# Patient Record
Sex: Female | Born: 1970 | Race: Black or African American | Hispanic: No | Smoking: Never smoker
Health system: Southern US, Community
[De-identification: ages and names within clinical notes are randomized; demographics above are authoritative.]

## PROBLEM LIST (undated history)

## (undated) DIAGNOSIS — N970 Female infertility associated with anovulation: Secondary | ICD-10-CM

## (undated) HISTORY — PX: PELVIC LAPAROSCOPY: SHX162

---

## 2000-09-29 ENCOUNTER — Inpatient Hospital Stay (HOSPITAL_COMMUNITY): Admission: AD | Admit: 2000-09-29 | Discharge: 2000-09-29 | Payer: Self-pay | Admitting: Obstetrics & Gynecology

## 2000-09-29 ENCOUNTER — Encounter: Payer: Self-pay | Admitting: Obstetrics & Gynecology

## 2000-11-01 ENCOUNTER — Encounter: Admission: RE | Admit: 2000-11-01 | Discharge: 2000-11-01 | Payer: Self-pay | Admitting: Obstetrics & Gynecology

## 2000-11-08 ENCOUNTER — Encounter: Admission: RE | Admit: 2000-11-08 | Discharge: 2000-11-08 | Payer: Self-pay | Admitting: Obstetrics & Gynecology

## 2000-11-18 ENCOUNTER — Encounter: Admission: RE | Admit: 2000-11-18 | Discharge: 2000-11-18 | Payer: Self-pay | Admitting: Obstetrics & Gynecology

## 2002-01-05 ENCOUNTER — Encounter: Payer: Self-pay | Admitting: Internal Medicine

## 2002-01-05 ENCOUNTER — Encounter: Admission: RE | Admit: 2002-01-05 | Discharge: 2002-01-05 | Payer: Self-pay | Admitting: Internal Medicine

## 2003-04-11 ENCOUNTER — Inpatient Hospital Stay (HOSPITAL_COMMUNITY): Admission: AD | Admit: 2003-04-11 | Discharge: 2003-04-12 | Payer: Self-pay | Admitting: Obstetrics & Gynecology

## 2003-05-30 ENCOUNTER — Encounter: Admission: RE | Admit: 2003-05-30 | Discharge: 2003-05-30 | Payer: Self-pay | Admitting: Obstetrics and Gynecology

## 2003-06-27 ENCOUNTER — Encounter: Admission: RE | Admit: 2003-06-27 | Discharge: 2003-06-27 | Payer: Self-pay | Admitting: Obstetrics and Gynecology

## 2003-12-17 ENCOUNTER — Emergency Department (HOSPITAL_COMMUNITY): Admission: EM | Admit: 2003-12-17 | Discharge: 2003-12-17 | Payer: Self-pay | Admitting: Family Medicine

## 2003-12-17 ENCOUNTER — Inpatient Hospital Stay (HOSPITAL_COMMUNITY): Admission: AD | Admit: 2003-12-17 | Discharge: 2003-12-17 | Payer: Self-pay | Admitting: *Deleted

## 2004-02-03 ENCOUNTER — Ambulatory Visit (HOSPITAL_COMMUNITY): Admission: RE | Admit: 2004-02-03 | Discharge: 2004-02-03 | Payer: Self-pay | Admitting: Obstetrics & Gynecology

## 2004-03-13 ENCOUNTER — Ambulatory Visit (HOSPITAL_COMMUNITY): Admission: RE | Admit: 2004-03-13 | Discharge: 2004-03-13 | Payer: Self-pay | Admitting: Obstetrics & Gynecology

## 2004-12-10 ENCOUNTER — Inpatient Hospital Stay (HOSPITAL_COMMUNITY): Admission: AD | Admit: 2004-12-10 | Discharge: 2004-12-10 | Payer: Self-pay | Admitting: Obstetrics & Gynecology

## 2006-06-27 ENCOUNTER — Ambulatory Visit: Payer: Self-pay | Admitting: Family Medicine

## 2006-08-18 ENCOUNTER — Encounter (INDEPENDENT_AMBULATORY_CARE_PROVIDER_SITE_OTHER): Payer: Self-pay | Admitting: Gynecology

## 2006-08-18 ENCOUNTER — Ambulatory Visit: Payer: Self-pay | Admitting: Gynecology

## 2006-09-15 ENCOUNTER — Ambulatory Visit: Payer: Self-pay | Admitting: Obstetrics & Gynecology

## 2008-02-07 ENCOUNTER — Inpatient Hospital Stay (HOSPITAL_COMMUNITY): Admission: AD | Admit: 2008-02-07 | Discharge: 2008-02-08 | Payer: Self-pay | Admitting: Obstetrics & Gynecology

## 2009-01-18 ENCOUNTER — Inpatient Hospital Stay (HOSPITAL_COMMUNITY): Admission: AD | Admit: 2009-01-18 | Discharge: 2009-01-19 | Payer: Self-pay | Admitting: Obstetrics & Gynecology

## 2009-02-26 ENCOUNTER — Encounter (INDEPENDENT_AMBULATORY_CARE_PROVIDER_SITE_OTHER): Payer: Self-pay | Admitting: Obstetrics & Gynecology

## 2009-02-26 ENCOUNTER — Ambulatory Visit: Payer: Self-pay | Admitting: Obstetrics & Gynecology

## 2009-03-13 ENCOUNTER — Ambulatory Visit: Payer: Self-pay | Admitting: Obstetrics and Gynecology

## 2009-09-30 ENCOUNTER — Emergency Department (HOSPITAL_COMMUNITY): Admission: EM | Admit: 2009-09-30 | Discharge: 2009-09-30 | Payer: Self-pay | Admitting: Family Medicine

## 2010-08-17 ENCOUNTER — Encounter: Payer: Self-pay | Admitting: *Deleted

## 2010-10-19 LAB — POCT URINALYSIS DIP (DEVICE)
Ketones, ur: NEGATIVE mg/dL
Protein, ur: NEGATIVE mg/dL
Specific Gravity, Urine: 1.02 (ref 1.005–1.030)

## 2010-10-19 LAB — DIFFERENTIAL
Basophils Relative: 1 % (ref 0–1)
Eosinophils Absolute: 0.2 10*3/uL (ref 0.0–0.7)
Eosinophils Relative: 4 % (ref 0–5)
Monocytes Absolute: 0.3 10*3/uL (ref 0.1–1.0)
Monocytes Relative: 5 % (ref 3–12)

## 2010-10-19 LAB — POCT I-STAT, CHEM 8
BUN: 10 mg/dL (ref 6–23)
Creatinine, Ser: 0.9 mg/dL (ref 0.4–1.2)
Potassium: 3.7 mEq/L (ref 3.5–5.1)
Sodium: 142 mEq/L (ref 135–145)
TCO2: 28 mmol/L (ref 0–100)

## 2010-10-19 LAB — CBC
HCT: 41.8 % (ref 36.0–46.0)
Hemoglobin: 14 g/dL (ref 12.0–15.0)
MCHC: 33.6 g/dL (ref 30.0–36.0)
MCV: 89.9 fL (ref 78.0–100.0)
RBC: 4.65 MIL/uL (ref 3.87–5.11)

## 2010-10-19 LAB — POCT PREGNANCY, URINE
Preg Test, Ur: NEGATIVE
Preg Test, Ur: NEGATIVE

## 2010-10-31 LAB — POCT URINALYSIS DIP (DEVICE)
Bilirubin Urine: NEGATIVE
Glucose, UA: NEGATIVE mg/dL
Hgb urine dipstick: NEGATIVE
Ketones, ur: NEGATIVE mg/dL
Nitrite: NEGATIVE
Protein, ur: NEGATIVE mg/dL
Specific Gravity, Urine: <=1.005 (ref 1.005–1.030)
Urobilinogen, UA: 0.2 mg/dL (ref 0.0–1.0)
pH: 5.5 (ref 5.0–8.0)

## 2010-11-02 LAB — URINALYSIS, ROUTINE W REFLEX MICROSCOPIC
Bilirubin Urine: NEGATIVE
Nitrite: NEGATIVE
Specific Gravity, Urine: 1.02 (ref 1.005–1.030)
Urobilinogen, UA: 0.2 mg/dL (ref 0.0–1.0)

## 2010-11-02 LAB — COMPREHENSIVE METABOLIC PANEL
Albumin: 3.7 g/dL (ref 3.5–5.2)
BUN: 12 mg/dL (ref 6–23)
Creatinine, Ser: 0.59 mg/dL (ref 0.4–1.2)
GFR calc Af Amer: >60 mL/min (ref >60)
Potassium: 3.8 mEq/L (ref 3.5–5.1)
Total Protein: 6.8 g/dL (ref 6.0–8.3)

## 2010-11-02 LAB — GC/CHLAMYDIA PROBE AMP, GENITAL
Chlamydia, DNA Probe: NEGATIVE
GC Probe Amp, Genital: NEGATIVE

## 2010-11-02 LAB — CBC
HCT: 37.5 % (ref 36.0–46.0)
MCV: 88.9 fL (ref 78.0–100.0)
Platelets: 196 10*3/uL (ref 150–400)
RDW: 14.7 % (ref 11.5–15.5)

## 2010-11-02 LAB — AMYLASE: Amylase: 50 U/L (ref 27–131)

## 2010-11-02 LAB — WET PREP, GENITAL
Clue Cells Wet Prep HPF POC: NONE SEEN
Trich, Wet Prep: NONE SEEN

## 2010-12-08 NOTE — Group Therapy Note (Signed)
[name], [name] NO.:  [id number]   MEDICAL RECORD NO.:  [id number]          PATIENT TYPE:  WOC   LOCATION:  [facility]                   FACILITY:  [facility]   PHYSICIAN:  Argentina Donovan, MD        DATE OF BIRTH:  Dec 20, 1970   DATE OF SERVICE:                                  CLINIC NOTE   The patient is a 40 year old African female from [place], gravida 1, para 0-  0-1-0 with a last pregnancy approximately 15 years ago.  She was married  at one time and had a divorce because she was infertile and she had a  laparoscopy done by Dr. Seymour Bars, which apparently showed polycystic  ovaries and she told her she was not ovulating.  She has a new partner  for 2 years who has had three children in the past and she has never  gotten pregnant.  Her periods are very irregular, sometimes going as  long as 4 months without a period.  The patient has some sign of  hirsutism with abnormal hair on her face and some acne and she is 5 feet  5 inches tall and weighs 252 pounds.  Her blood pressure is normal at  129/83 and her pulse is 72 per minute.  She was originally complaining  of upper right quadrant pain.  Ultrasound of the pelvis and the abdomen,  the pelvis was completely normal.  The abdomen showed a gallbladder  without stones.  From listening to her complaints, it sounds like she  could have some gallbladder sludge.  I have told her to try and keep a  calendar and see if what she ate especially fatty foods, which related  to increase in pain in that area.  In the interim, we may start her on  some Clomid after a withdrawal with Provera prior to doing any other  significant workup, although she has had a thyroid test in the past,  which was normal, and she was also checked for prolactin, which was  normal.  I think that this is probably a typical case of polycystic  ovarian syndrome and the patient is not a diabetic.  We will put her on  a basal temperature thermometer chart.  She  apparently took 2 months of  Clomid before in a low dose and did not respond.  I have started her on  100 mg a day for 5 days for 2 months on a temperature chart, after that  she will come in, I told her after the first month if she does not have  a period within 30 days after she completes the first month of Clomid  that I want to see her sooner than that.  She seems to understand, she  has taken a temperature before, so prior to doing any workup such as  sperm analysis or further chemical workup that will cost her more money,  I think they will go ahead and give her a trial on the Clomid.   IMPRESSION:  Polycystic ovarian syndrome, primary infertility, history  of normal laparoscopy.  ______________________________  Argentina Donovan, MD     PR/MEDQ  D:  03/13/2009  T:  03/14/2009  Job:  [id number]

## 2010-12-08 NOTE — Group Therapy Note (Signed)
[name], [name] NO.:  [id number]   MEDICAL RECORD NO.:  [id number]          PATIENT TYPE:  WOC   LOCATION:  [facility]                   FACILITY:  [facility]   PHYSICIAN:  Dorthula Perfect, MD     DATE OF BIRTH:  November 02, 1970   DATE OF SERVICE:  02/26/2009                                  CLINIC NOTE   A 40 year old, African female, gravida 1, aborta 1, last menstrual  period started July 16 is here with a complaint of pelvic pain and  amenorrhea referred from the [facility].  She was seen here February 2008 by  myself complaining of irregular periods and wanting to become pregnant.  At that time, the clinic Manager had discussed with her the what is  involved with an infertility evaluation.  At that time, her TSH,  prolactin, and glucose levels that has been ordered earlier by Dr.  Mia Creek were normal.  In 2009, she had regular monthly periods.  This  year, 2010, her periods occur about every 3 months.  She is doing  nothing for birth control.  For the past 2 months, she has had right-  sided pain when she is ambulatory, but not while sleeping.   REVIEW OF SYSTEMS:  She has no food intolerance.  She has occasional  constipation and that she will have a bowel movement every couple days.  She has no history of diarrhea.  She has no urinary frequency.  She does  not see blood in her urine and also has no dysuria.   PHYSICAL EXAMINATION:  VITAL SIGNS:  Height 5 feet 5 inches, weight 252  pounds, that is gain of about 17 pounds since 2 years ago.  Blood  pressure is 129/83.  ABDOMEN:  Her abdomen is obese.  She has direct tenderness in the right  upper quadrant.  She has no other abdominal tenderness.  She has no  guarding or rebound tenderness.  She has a questionable right CVA  tenderness, but I wonder if I am just jostling the abdominal contents  around.  Her upper abdominal pain does not radiate downwards.  PELVIC:  External genitalia, BUS glands normal.  Vaginal vault  was  epithelialized as was the cervix.  Pap smear was done.  BIMANUAL:  Done.  It was quite difficult to feel the uterus in the  adnexal areas, but I believe they are normal.   IMPRESSION:  1. Right upper quadrant pain.  2. Oligomenorrhea.   DISPOSITION:  1. Micro urinalysis.  2. Gallbladder ultrasound.  3. She will return in 2 weeks for results and at that time, we will      talk with the clinic staff about starting a infertility workup.      This has been discussed with her in the past.          ______________________________  Dorthula Perfect, MD    ER/MEDQ  D:  02/26/2009  T:  02/27/2009  Job:  [id number]

## 2010-12-11 NOTE — Group Therapy Note (Signed)
NAMEMICHALENE, [name] NO.:  [id number]   MEDICAL RECORD NO.:  [id number]          PATIENT TYPE:  WOC   LOCATION:  [facility]                   [facility]ACILITY:  [facility]   PHYSICIAN:  Ginger Carne, MD DATE OF BIRTH:  11-05-1970   DATE OF SERVICE:  08/18/2006                                  CLINIC NOTE   REASON FOR CONSULTATION:  Oligomenorrhea.   This patient is a 40 year old African American female who has had a  multiple-year history of oligomenorrhea and concerns regarding  infertility.  At the outset of her appointment, Tresa Endo __________, clinic  manager, discussed with the patient full disclosure regarding costs up  front for infertility evaluation. It was agreed between the patient and  Ms. __________ that this point would be directed solely for issues about  oligomenorrhea, and infertility would be deferred to a later time.  The  patient denies galactorrhea, chronic headaches, and concerns regarding  hirsutism.  She takes no medications that would contribute to  oligomenorrhea.  The patient bleeds approximately 4-7 days every 3-6  months.  She does not use oral contraceptives.   PHYSICAL EXAMINATION:  GENERAL:  No evidence of hirsutism.  ABDOMEN:  Demonstrates central obesity.  PELVIC:  Uterus small, anteverted, and flexed.  Both adnexa palpable,  found to be normal.   IMPRESSION:  Oligomenorrhea, probable polycystic ovarian syndrome.   PLAN:  TSH, serum HCG prolactin, and 75 g 2-hour glucose insulin level  testing was ordered.  At the time of this dictation, TSH, prolactin were  within normal limits, and serum HCG demonstrated no evidence for  pregnancy.  The patient was unable to afford the cost of a 75 g 2-hour  glucose insulin level, according to nursing staff, and this was deferred  as well.  She will return in 3 weeks for follow-up conversation  regarding her testing and further management of oligomenorrhea.     ______________________________  Ginger Carne, MD     SHB/MEDQ  D:  08/19/2006  T:  08/19/2006  Job:  [id number]

## 2010-12-11 NOTE — Group Therapy Note (Signed)
NAMEKRISTOL, [name] NO.:  [id number]   MEDICAL RECORD NO.:  [id number]          PATIENT TYPE:  WOC   LOCATION:  [facility]                   [facility]AC[facility]   PHYSICIAN:  Dorthula Perfect, MD     DATE OF BIRTH:  1971/07/11   DATE OF SERVICE:  09/15/2006                                  CLINIC NOTE   Please note this patient has two medical record numbers. One is  [id number] and the other medical record number is [id number]. This can  potentially have confusion when looking for her records.   A 39 year old [place] American female with a long history of  oligomenorrhea with concerns about having regular periods and becoming  pregnant who returns for followup visit. She was seen here by Dr.  Mia Creek on August 18, 2006. The clinic manager at that time discussed  with her the cost for infertility evaluation. Lab work was done. TSH,  prolactin and glucose levels were normal.   She says she was seen in the [facility] recently and was told she had a  Trichomonas infection. Review of both history numbers does not reveal  any information on that visit. For some time, she has had irregular  periods. Her last period was in December. She will go as long as three  months without having a period. She does not have other gyn complaints.   PHYSICAL EXAMINATION:  Is limited to the abdomen and pelvis. Her abdomen  is somewhat obese, soft and nontender. No masses are felt.  PELVIC: Is basically normal. The uterus is small and of normal size.  Adnexal areas were normal.   A wet prep was done which did not confirm the patient as having  Trichomonas. She does however have clue cells noted compatible with  bacterial vaginal infection.   IMPRESSION:  1. Bacterial vaginitis.  2. Irregular menstrual periods (oligomenorrhea).   DISPOSITION:  1. Flagyl 500 mg to take one twice a day for seven days, fourteen      tablets.  2. After much discussion with the patient, I get the impression  that      she wants to have regular periods all the time, but does not want      to take birth control pills because then she will not be able to      get pregnant. Therefore, I have given her a prescription for      Provera 10 mg 10 tablets with three refills to take first      prescription for the next ten days. I have told her this will most      likely bring on her period this time only. The clinic manager spoke      with the patient again and the patient understands that to carry on      with infertility, IE, Clomid 50 mg or 100 mg the      former of which she was on in 2004, will require her being logged      in under the infertility codes and co-payments. She understands      this.  ______________________________  Dorthula Perfect, MD     ER/MEDQ  D:  09/15/2006  T:  09/15/2006  Job:  [id number]

## 2010-12-11 NOTE — Op Note (Signed)
NAMEBENTLEIGH, WAREN                         ACCOUNT NO.:  [id number]   MEDICAL RECORD NO.:  [id number]                   PATIENT TYPE:  AMB   LOCATION:  [facility]                                  [facility]   PHYSICIAN:  Genia Del, M.D.             DATE OF BIRTH:  [birth year]   DATE OF PROCEDURE:  03/13/2004  DATE OF DISCHARGE:                                 OPERATIVE REPORT   PREOPERATIVE DIAGNOSES:  1. Primary infertility.  2. History of left ectopic pregnancy.  3. Left proximal tubal obstruction per hysterosalpingography.   POSTOPERATIVE DIAGNOSES:  1. Primary infertility.  2. History of left ectopic pregnancy.  3. Left proximal tubal obstruction per hysterosalpingography.  4. Confirmed left proximal tubal obstruction.   INTERVENTION:  Open laparoscopy with chromopertubation with methylene blue.   SURGEON:  Genia Del, M.D.   ASSISTANT:  Richardean Sale, M.D.   ANESTHESIOLOGIST:  Raul Del, M.D.   DESCRIPTION OF PROCEDURE:  Under general anesthesia with endotracheal  intubation, the patient is in lithotomy position for operative laparoscopy.  She is prepped with Hibiclens on the abdominal suprapubic, vulva and vaginal  areas and draped as usual. The vaginal exam reveals an anteverted uterus,  normal volume, no adnexal mass. The speculum is introduced in the vagina,  the anterior lip of the cervix is grasped with a tenaculum and the uterus is  cannulated. We removed the speculum. Abdominally we infiltrated the  subcutaneous tissue with Marcaine 0.25 7 mL. We then make an infraumbilical  incision over 2 cm with a scalpel. We opened the aponeurosis with Mayo  scissors under direct vision and opened the parietoperitineum with the Mayo  scissors under direct vision. We make a pursestring stitch of #0 Vicryl all  around the aponeurosis. We introduce the Hasson and the laparoscope at that  level. We inspect the pelvic cavity, no adhesion is present  anteriorly  therefore a 5 mm trocar is put in place on the left iliac area. We  infiltrate the subcutaneous tissue with Marcaine 3 mL. We make a scalpel  incision over 5 mm and introduce the 5 mm trocar at that level under direct  vision. The probe is inserted.  We inspect the pelvic cavity, the uterus is  normal in size and appearance, it is anteverted and mobile. No lesion is  present on the anterior cul-de-sac and posterior cul-de-sac, no adhesion, no  evidence of endometriosis. The right adnexa is completely normal. The  fimbria are well seen and healthy.  The right ovary is normal in appearance  and volume. The last adnexa appears normal distally with good fimbria. The  left ovary is normal in appearance and size. Some possible scarring is  present at the proximal aspect of the left tube but no adhesion is seen. We  inspect the abdominal cavity, the liver and bowels are normal to inspection.  We then proceed with methylene blue chromopertubation that shows  a very good  passage on the right side. The methylene blue is seen at the distal end of  the right tube going into the intraperitoneal cavity so the right tube is  normal and patent. The left tube does not fill in with methylene blue at  all. There appears to be a proximal obstruction of the left tube, no  methylene blue is seen at the distal end of the left tube.  We irrigate and  suction the abdominopelvic cavity with a Nezhat, hemostasis is adequate. The  instruments are removed, the CO2 is evacuated. The left 5 mm trocar is  removed under direct vision. We removed the laparoscope and the Hasson at  the infraumbilical incision. We feel that the pursestring suture of #0  Vicryl is not satisfactory at the aponeurosis, we remove it and redo it with  a #0 Vicryl again. This appears stronger and is all around at the level of  the aponeurosis. We then attach that suture making sure that no  intraabdominal content comes in. We then close  the skin with a subcuticular  stitch of 4-0 Monocryl, hemostasis is adequate at that level. Dermabond is  used at the left iliac incision. The instruments are removed vaginally, the  estimated blood loss was minimal. No complication occurred and the patient  was transferred to the recovery room in good status.                                               Genia Del, M.D.    ML/MEDQ  D:  03/13/2004  T:  03/13/2004  Job:  [id number]

## 2010-12-11 NOTE — Group Therapy Note (Signed)
[name], DEMETRIUS NO.:  [id number]   MEDICAL RECORD NO.:  [id number]                   PATIENT TYPE:  OUT   LOCATION:  [facility]                           FACILITY:  [facility]   PHYSICIAN:  Tinnie Gens, MD                     DATE OF BIRTH:  03-Sep-1970   DATE OF SERVICE:  05/30/2003                                    CLINIC NOTE   CHIEF COMPLAINT:  Infertility.   HISTORY OF PRESENT ILLNESS:  The patient is a 40 year old G1, P0 who has  previously been seen in this clinic and several others for secondary  infertility.  Apparently she was pregnant in her youth around 34 and had a  termination.  Since that time she has been with two separate partners  including one that has two other children with unprotected intercourse for  an extended period of time without any fertility.  She has oligomenorrhea  and what seems to be oligo-ovulation.  She has reported here previously that  she has no history of pelvic infections.  However, she brings records today  from a clinic in [place] which does reveal possible history of two pelvic  infections.  She was divorced from her first husband because she could not  have children.  She has been with her recent partner who has two children  for five months with no conception.  She does have a work-up that included a  normal TSH in this clinic two years ago as well as the records that she  brings with her from [place] which show a normal thyroid panel, an LH/FSH  ratio of greater than 3:1 with an LH of 23.83 and an FSH of 7.28.  Prolactin  is normal.  Testosterone is normal.  DHEAS was 500.  These findings are  somewhat suggestive of PCOS.  She has not ever had an HSG.  She had it  scheduled and never got it completed.  Her partner has not had a current  semen analysis, although he previously has two children.  The patient has  been given basal body temperature charts in the past.  However, it does not  look like she has  had enough instruction as to how to complete it.  She has  never been on any medication for infertility.   PAST MEDICAL HISTORY:  Negative.   PAST SURGICAL HISTORY:  Negative.   MEDICATIONS:  None.   ALLERGIES:  None.   GYN HISTORY:  Menarche 17.  Irregular cycles every two to three months.  Periods last five days.  She does have moderate amount of pain with periods.  She has no history of abnormal Pap smears.  Her current last menstrual  period was April 13, 2003.   FAMILY HISTORY:  Diabetes and hypertension in a father and mother.   SOCIAL HISTORY:  She denies alcohol, drugs, or tobacco use.  She currently  lives  with her fiance.  She braids hair for a living.   REVIEW OF SYSTEMS:  14-point review of systems is reviewed and negative with  the exception of fatigue, nausea, vaginal odor, and vaginal itching.   PHYSICAL EXAMINATION:  GENERAL:  She is a well-developed, somewhat obese  black female in no acute distress.  VITAL SIGNS:  Pulse 89, blood pressure 112/74, weight 228.6.  LUNGS:  Clear bilaterally.  CARDIOVASCULAR:  Regular rate, rhythm.  No murmurs, rubs, or gallops.  ABDOMEN:  Soft, nontender, nondistended.  NECK:  Supple.  She has normal feeling thyroid.  GENITOURINARY:  Normal external female genitalia.  The cervix is visualized  and is nulliparous and without lesions.  The uterus is anteverted, small,  nontender.  Adnexa are without tenderness.   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   PLAN:  Trial of basal body temperature testing after a Provera challenge.  She will also get financial counseling regarding payment for treatment  followed by probable trial of Glucophage plus or minus Clomid.  These are  fairly inexpensive medications that she can do without necessarily having to  undergo a broad spectrum of radiographic work-up or without needing  exceptionally expensive hormonal manipulation.                                                Tinnie Gens, MD    TP/MEDQ  D:  05/31/2003  T:  05/31/2003  Job:  [id number]

## 2011-01-05 ENCOUNTER — Emergency Department (HOSPITAL_COMMUNITY)
Admission: EM | Admit: 2011-01-05 | Discharge: 2011-01-06 | Disposition: A | Discharge status: Home / Self Care | Payer: Self-pay | Attending: Emergency Medicine | Admitting: Emergency Medicine

## 2011-01-05 DIAGNOSIS — IMO0001 Reserved for inherently not codable concepts without codable children: Secondary | ICD-10-CM | POA: Insufficient documentation

## 2011-01-05 DIAGNOSIS — R0602 Shortness of breath: Secondary | ICD-10-CM | POA: Insufficient documentation

## 2011-01-05 DIAGNOSIS — Z0000 Encounter for general adult medical examination without abnormal findings: Secondary | ICD-10-CM | POA: Insufficient documentation

## 2011-01-09 ENCOUNTER — Emergency Department (HOSPITAL_COMMUNITY)
Admission: EM | Admit: 2011-01-09 | Discharge: 2011-01-10 | Disposition: A | Discharge status: Home / Self Care | Payer: Self-pay | Attending: Emergency Medicine | Admitting: Emergency Medicine

## 2011-01-09 ENCOUNTER — Emergency Department (HOSPITAL_COMMUNITY): Payer: Self-pay

## 2011-01-09 DIAGNOSIS — R079 Chest pain, unspecified: Secondary | ICD-10-CM | POA: Insufficient documentation

## 2011-01-09 DIAGNOSIS — R0602 Shortness of breath: Secondary | ICD-10-CM | POA: Insufficient documentation

## 2011-01-09 DIAGNOSIS — J45909 Unspecified asthma, uncomplicated: Secondary | ICD-10-CM | POA: Insufficient documentation

## 2011-02-22 ENCOUNTER — Inpatient Hospital Stay (INDEPENDENT_AMBULATORY_CARE_PROVIDER_SITE_OTHER)
Admission: RE | Admit: 2011-02-22 | Discharge: 2011-02-22 | Disposition: A | Discharge status: Home / Self Care | Payer: Self-pay | Source: Ambulatory Visit | Attending: Family Medicine | Admitting: Family Medicine

## 2011-02-22 DIAGNOSIS — R002 Palpitations: Secondary | ICD-10-CM

## 2011-02-22 DIAGNOSIS — M940 Chondrocostal junction syndrome [Tietze]: Secondary | ICD-10-CM

## 2011-04-23 LAB — GC/CHLAMYDIA PROBE AMP, GENITAL
Chlamydia, DNA Probe: NEGATIVE
GC Probe Amp, Genital: NEGATIVE

## 2011-04-23 LAB — URINALYSIS, ROUTINE W REFLEX MICROSCOPIC
Glucose, UA: NEGATIVE
Hgb urine dipstick: NEGATIVE
Specific Gravity, Urine: <1.005 — ABNORMAL LOW
pH: 5.5

## 2011-04-23 LAB — WET PREP, GENITAL

## 2011-04-23 IMAGING — US US ABDOMEN COMPLETE
1 series · 14 of 25 positions shown · non-contrast
Comparison: None

CLINICAL DATA: Abdominal pain

COMPLETE ABDOMINAL ULTRASOUND

[Series 1: us abdomen complete · 0.28mm/px · 14 of 61 slices shown]
[im 1/61]
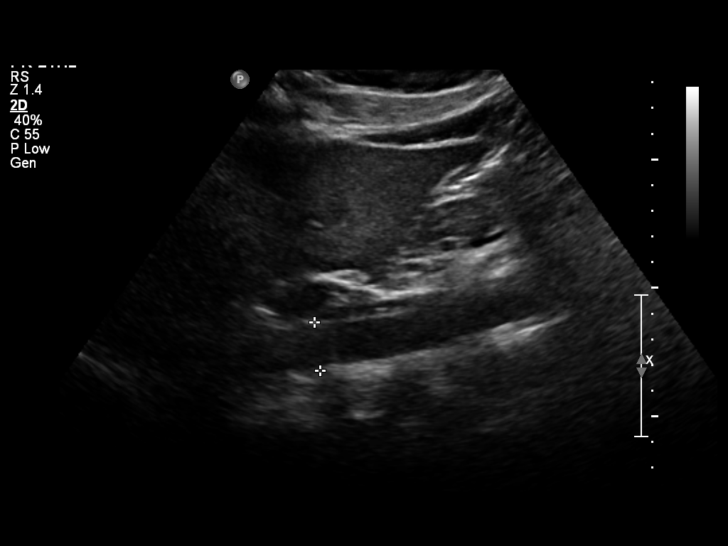
[im 6/61]
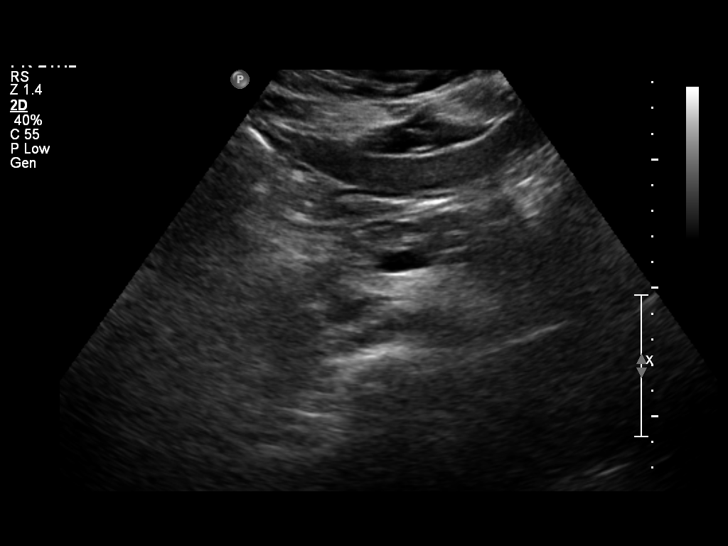
[im 11/61]
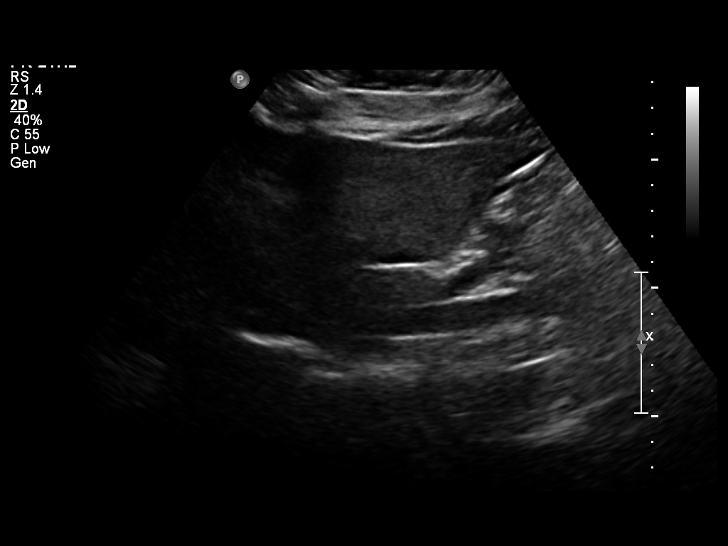
[im 16/61]
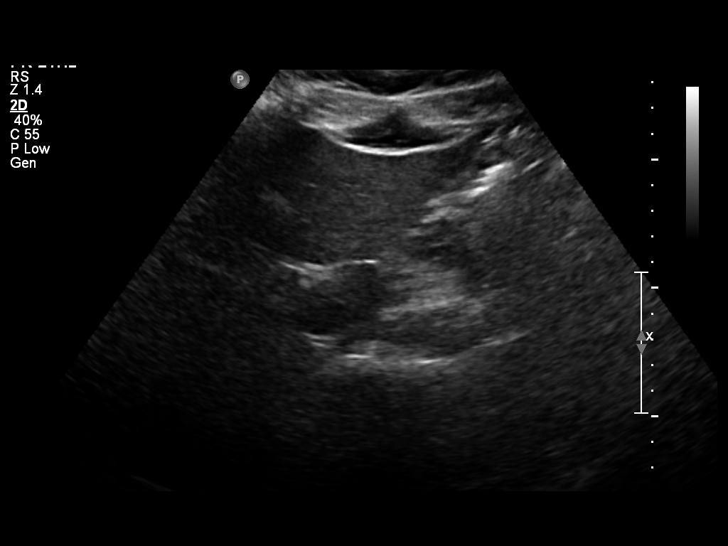
[im 21/61]
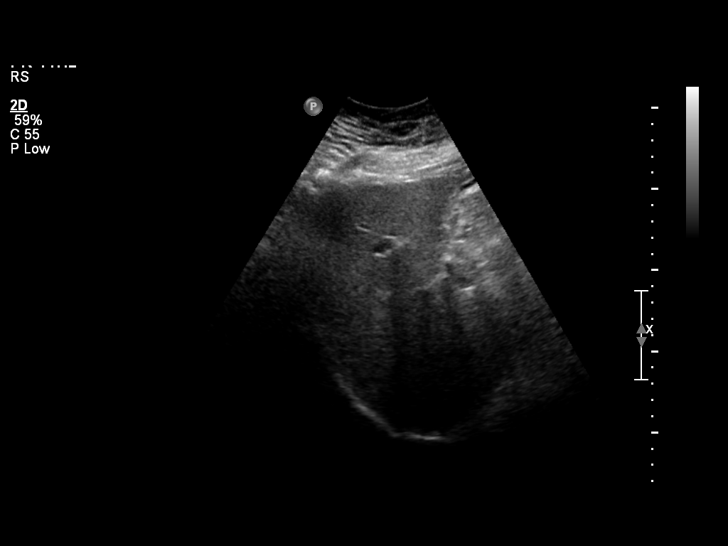
[im 23/61]
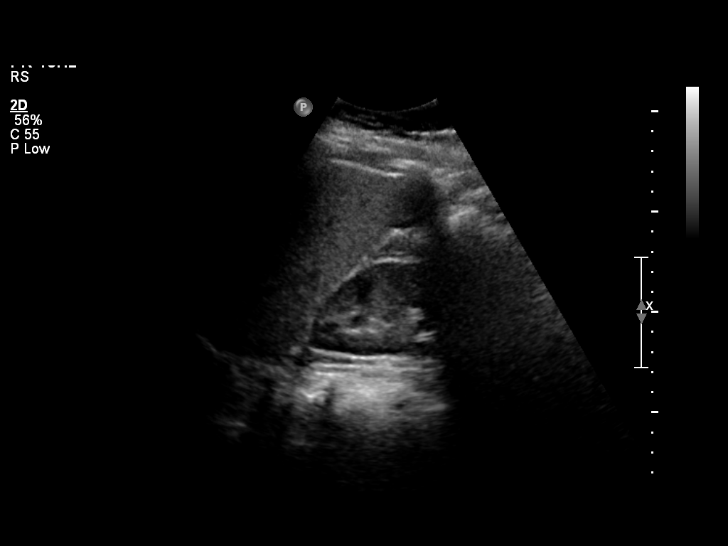
[im 28/61]
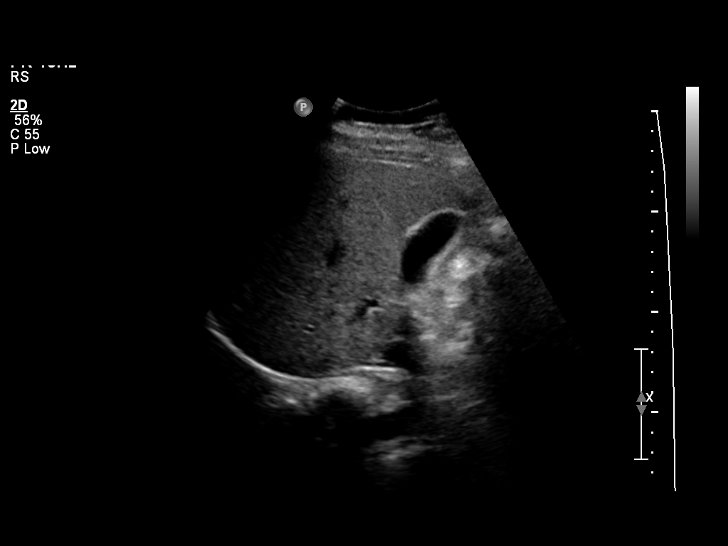
[im 33/61]
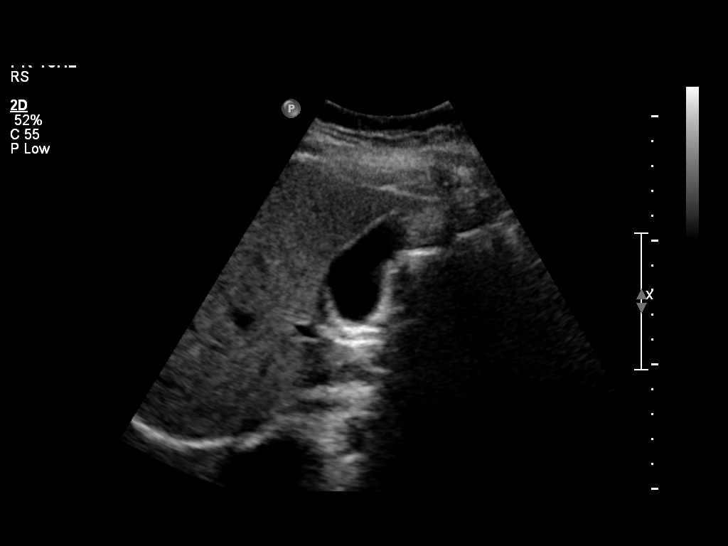
[im 38/61]
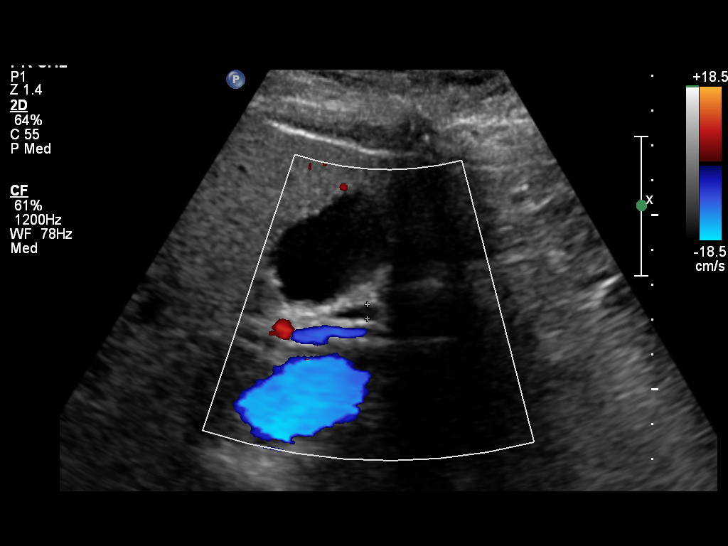
[im 41/61]
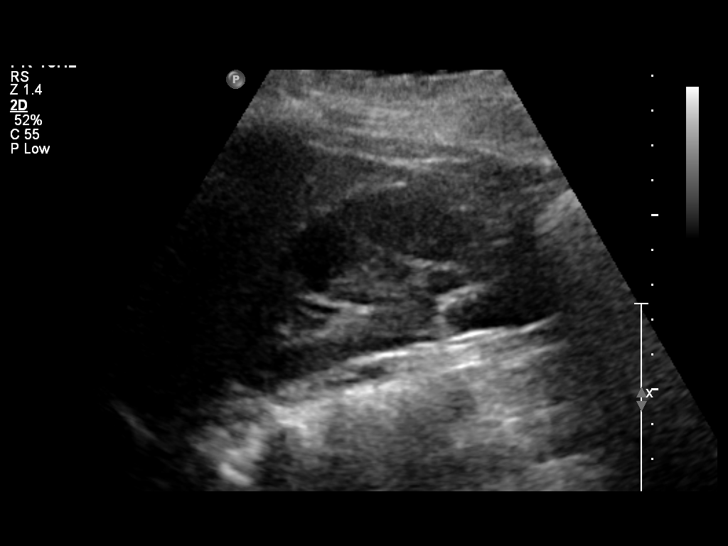
[im 46/61]
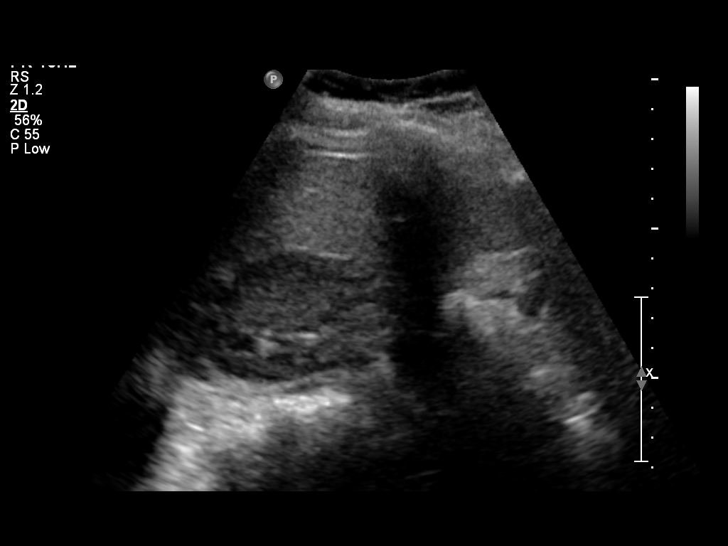
[im 51/61]
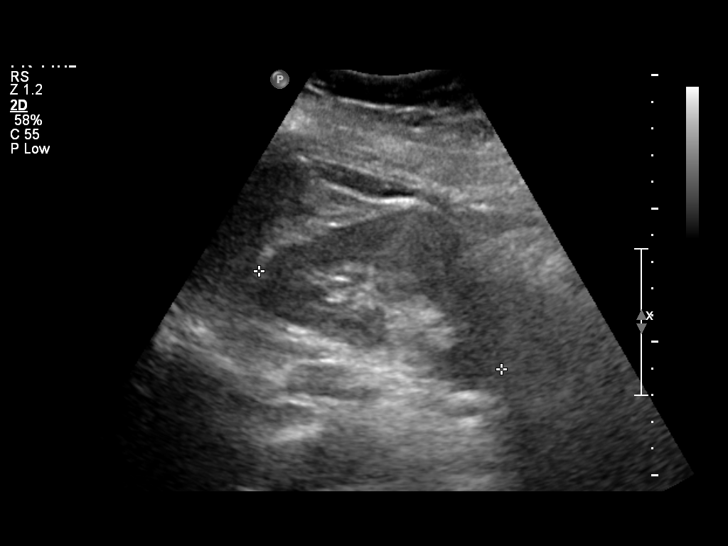
[im 56/61]
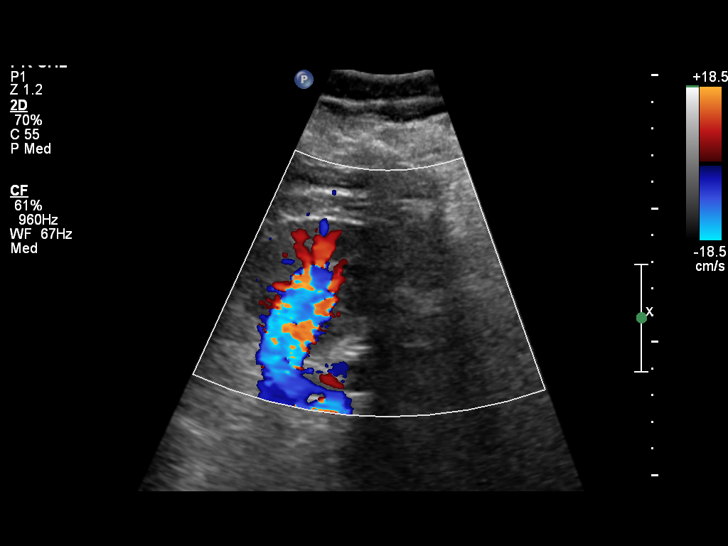
[im 61/61]
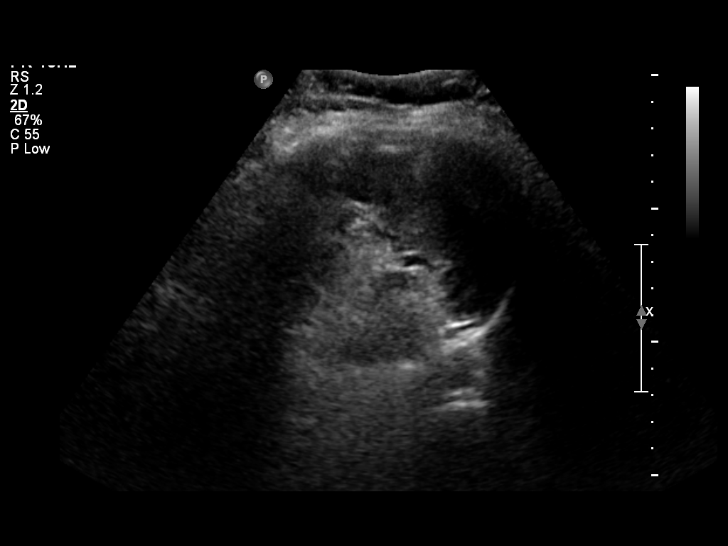

[14 of 25 positions shown; findings below may reference images not displayed]

FINDINGS: Gallbladder:  No gallstones, gallbladder wall thickening, or
pericholecystic fluid.

Common bile duct: Within normal limits in caliber, 4 mm.

Liver:  No focal parenchymal abnormalities.  Within normal limits
in parenchymal echogenicity.

Inferior vena cava:  Visualized portion unremarkable.

Pancreas:  Visualized portion unremarkable.

Spleen:  Within normal limits in size and echogenicity.

Right kidney:  Within normal limits in size and echogenicity. No
evidence of mass or hydronephrosis.

Left kidney:  Within normal limits in size and echogenicity. No
evidence of mass or hydronephrosis.

Abdominal aorta:  Within normal limits in caliber.
IMPRESSION: Unremarkable abdominal ultrasound.

## 2011-05-05 ENCOUNTER — Inpatient Hospital Stay (INDEPENDENT_AMBULATORY_CARE_PROVIDER_SITE_OTHER)
Admission: RE | Admit: 2011-05-05 | Discharge: 2011-05-05 | Disposition: A | Discharge status: Home / Self Care | Payer: Self-pay | Source: Ambulatory Visit | Attending: Family Medicine | Admitting: Family Medicine

## 2011-05-05 DIAGNOSIS — J45909 Unspecified asthma, uncomplicated: Secondary | ICD-10-CM

## 2011-05-05 DIAGNOSIS — J069 Acute upper respiratory infection, unspecified: Secondary | ICD-10-CM

## 2011-05-19 ENCOUNTER — Inpatient Hospital Stay (HOSPITAL_COMMUNITY)
Admission: AD | Admit: 2011-05-19 | Discharge: 2011-05-19 | Disposition: A | Discharge status: Home / Self Care | Payer: Self-pay | Source: Ambulatory Visit | Attending: Obstetrics and Gynecology | Admitting: Obstetrics and Gynecology

## 2011-05-19 ENCOUNTER — Encounter (HOSPITAL_COMMUNITY): Payer: Self-pay | Admitting: *Deleted

## 2011-05-19 DIAGNOSIS — K649 Unspecified hemorrhoids: Secondary | ICD-10-CM | POA: Insufficient documentation

## 2011-05-19 DIAGNOSIS — K648 Other hemorrhoids: Secondary | ICD-10-CM

## 2011-05-19 HISTORY — DX: Female infertility associated with anovulation: N97.0

## 2011-05-19 LAB — URINALYSIS, ROUTINE W REFLEX MICROSCOPIC
Bilirubin Urine: NEGATIVE
Glucose, UA: NEGATIVE mg/dL
Hgb urine dipstick: NEGATIVE
Specific Gravity, Urine: <1.005 — ABNORMAL LOW (ref 1.005–1.030)
pH: 5.5 (ref 5.0–8.0)

## 2011-05-19 MED ORDER — HYDROCORTISONE ACETATE 25 MG RE SUPP: 25.0000 mg | Freq: Two times a day (BID) | RECTAL | Status: AC

## 2011-05-19 NOTE — Progress Notes (Signed)
Pt reports she has a hemorrhoid and has used OTC's x 3 weeks but it isn't getting any better. Having bleeding and pain when she has a bm. Also reports rt side pain that radiates down into her legs x 1 year. States she has nausea sometimes and headaches sometimes. States with her last period the "blood was black". States she is afraid something is really wrong with her stomach.states she doesn't think she has ever seen a gynecologist.

## 2011-05-19 NOTE — ED Provider Notes (Signed)
History   Pt presents today c/o rectal pain. She states she has had hemorrhoids in the past and thinks she has another. She states she has had worsening pain for the past 3wks. She has used OTC medications without relief. She also states she has been constipated but is having stools. She denies fever, HA, vag dc, or abd pain at this time.  Chief Complaint  Patient presents with  . Abdominal Pain  . Hemorrhoids   HPI  OB History    Grav Para Term Preterm Abortions TAB SAB Ect Mult Living                  Past Medical History  Diagnosis Date  . Infertility associated with anovulation   . Asthma     Past Surgical History  Procedure Date  . Pelvic laparoscopy     No family history on file.  History  Substance Use Topics  . Smoking status: Never Smoker   . Smokeless tobacco: Never Used  . Alcohol Use: No    Allergies: No Known Allergies  Prescriptions prior to admission  Medication Sig Dispense Refill  . acetaminophen (TYLENOL) 325 MG tablet Take 650 mg by mouth every 6 (six) hours as needed. headache       . albuterol (PROVENTIL) (2.5 MG/3ML) 0.083% nebulizer solution Take 2.5 mg by nebulization every 6 (six) hours as needed. Shortness of breath        Review of Systems  Constitutional: Negative for fever.  Cardiovascular: Negative for chest pain.  Gastrointestinal: Positive for constipation. Negative for nausea, vomiting, abdominal pain and diarrhea.  Genitourinary: Negative for dysuria, urgency, frequency and hematuria.  Neurological: Negative for dizziness and headaches.  Psychiatric/Behavioral: Negative for depression and suicidal ideas.   Physical Exam   Blood pressure 124/79, pulse 80, temperature 98.1 F (36.7 C), resp. rate 20, height 5\' 6"  (1.676 m), weight 254 lb (115.214 kg), last menstrual period 05/10/2011, SpO2 98.00%.  Physical Exam  Nursing note and vitals reviewed. Constitutional: She is oriented to person, place, and time. She appears  well-developed and well-nourished. No distress.  HENT:  Head: Normocephalic and atraumatic.  Eyes: EOM are normal. Pupils are equal, round, and reactive to light.  GI: Soft. She exhibits no distension. There is no tenderness. There is no rebound and no guarding.  Genitourinary: Rectal exam shows internal hemorrhoid.       Small internal hemorrhoid noted. No bleeding.  Neurological: She is alert and oriented to person, place, and time.  Skin: Skin is warm and dry. She is not diaphoretic.  Psychiatric: She has a normal mood and affect. Her behavior is normal. Judgment and thought content normal.    MAU Course  Procedures  Results for orders placed during the hospital encounter of 05/19/11 (from the past 24 hour(s))  URINALYSIS, ROUTINE W REFLEX MICROSCOPIC     Status: Abnormal   Collection Time   05/19/11  9:15 PM      Component Value Range   Color, Urine YELLOW  YELLOW    Appearance CLEAR  CLEAR    Specific Gravity, Urine <1.005 (*) 1.005 - 1.030    pH 5.5  5.0 - 8.0    Glucose, UA NEGATIVE  NEGATIVE (mg/dL)   Hgb urine dipstick NEGATIVE  NEGATIVE    Bilirubin Urine NEGATIVE  NEGATIVE    Ketones, ur NEGATIVE  NEGATIVE (mg/dL)   Protein, ur NEGATIVE  NEGATIVE (mg/dL)   Urobilinogen, UA 0.2  0.0 - 1.0 (mg/dL)   Nitrite  NEGATIVE  NEGATIVE    Leukocytes, UA NEGATIVE  NEGATIVE   POCT PREGNANCY, URINE     Status: Normal   Collection Time   05/19/11  9:43 PM      Component Value Range   Preg Test, Ur NEGATIVE       Assessment and Plan  Hemorrhoid: discussed with pt at length. Will give Rx for anusol hc supp. She will use mirilax for constipation. Advised pt to f/u with PCP. Discussed diet, activity, risks, and precautions.  Clinton Gallant. Rice III, DrHSc, MPAS, PA-C  05/19/2011, 10:12 PM   Henrietta Hoover, PA 05/19/11 2216

## 2011-05-25 NOTE — ED Provider Notes (Signed)
Attestation of Attending Supervision of Advanced Practitioner: Evaluation and management procedures were performed by the PA/NP/CNM/OB Fellow under my supervision/collaboration. Chart reviewed and agree with management and plan.  [name] V 05/25/2011 6:50 PM

## 2011-11-11 ENCOUNTER — Inpatient Hospital Stay (HOSPITAL_COMMUNITY)
Admission: AD | Admit: 2011-11-11 | Discharge: 2011-11-12 | Disposition: A | Discharge status: Home / Self Care | Payer: Self-pay | Source: Ambulatory Visit | Attending: Obstetrics & Gynecology | Admitting: Obstetrics & Gynecology

## 2011-11-11 DIAGNOSIS — N949 Unspecified condition associated with female genital organs and menstrual cycle: Secondary | ICD-10-CM | POA: Insufficient documentation

## 2011-11-11 DIAGNOSIS — A6000 Herpesviral infection of urogenital system, unspecified: Secondary | ICD-10-CM | POA: Insufficient documentation

## 2011-11-11 NOTE — MAU Note (Signed)
Vaginal burning x 3 days, denies discharge. Used monostat last pm and this made it worse. External burning with urination. LMP 11/01/2011

## 2011-11-12 ENCOUNTER — Encounter (HOSPITAL_COMMUNITY): Payer: Self-pay | Admitting: *Deleted

## 2011-11-12 LAB — WET PREP, GENITAL
Clue Cells Wet Prep HPF POC: NONE SEEN
Trich, Wet Prep: NONE SEEN
Yeast Wet Prep HPF POC: NONE SEEN

## 2011-11-12 LAB — URINALYSIS, ROUTINE W REFLEX MICROSCOPIC
Glucose, UA: NEGATIVE mg/dL
Protein, ur: NEGATIVE mg/dL
Specific Gravity, Urine: 1.01 (ref 1.005–1.030)
pH: 5.5 (ref 5.0–8.0)

## 2011-11-12 LAB — URINE MICROSCOPIC-ADD ON

## 2011-11-12 LAB — HERPES SIMPLEX VIRUS(HSV) DNA BY PCR: HSV 1 DNA: NOT DETECTED

## 2011-11-12 MED ORDER — ACYCLOVIR 400 MG PO TABS: 400.0000 mg | ORAL_TABLET | Freq: Three times a day (TID) | ORAL | Status: AC

## 2011-11-12 MED ORDER — LIDOCAINE HCL 2 % EX GEL: CUTANEOUS | Status: DC | PRN

## 2011-11-12 MED ORDER — LIDOCAINE HCL 2 % EX GEL
Freq: Once | CUTANEOUS | Status: AC
  Administered 2011-11-12: 10 via TOPICAL
  Filled 2011-11-12: qty 20

## 2011-11-12 NOTE — MAU Provider Note (Signed)
Attestation of Attending Supervision of Advanced Practitioner: Evaluation and management procedures were performed by the OB Fellow/PA/CNM/NP under my supervision and collaboration. Chart reviewed, and agree with management and plan.  Renlee Floor, M.D. 11/12/2011 2:16 PM   

## 2011-11-12 NOTE — MAU Provider Note (Signed)
History     CSN: [id number]  Arrival date and time: 11/11/11 2329   None     Chief Complaint  Patient presents with  . Vaginal Pain   HPI 41 y.o. G0P0 with vaginal burning x 3 days, external burning with urination, no itching, no discharge. External pain with intercourse.    Past Medical History  Diagnosis Date  . Infertility associated with anovulation   . Asthma     Past Surgical History  Procedure Date  . Pelvic laparoscopy     No family history on file.  History  Substance Use Topics  . Smoking status: Never Smoker   . Smokeless tobacco: Never Used  . Alcohol Use: No    Allergies: Not on File  Prescriptions prior to admission  Medication Sig Dispense Refill  . acetaminophen (TYLENOL) 325 MG tablet Take 650 mg by mouth every 6 (six) hours as needed. headache       . albuterol (PROVENTIL HFA;VENTOLIN HFA) 108 (90 BASE) MCG/ACT inhaler Inhale 2 puffs into the lungs as needed. Used for resp. Distress.      Marland Kitchen ibuprofen (ADVIL,MOTRIN) 600 MG tablet Take 600 mg by mouth as needed. Used for pain.      . miconazole (MONISTAT-3) KIT Place vaginally at bedtime.        Review of Systems  Constitutional: Negative.   Respiratory: Negative.   Cardiovascular: Negative.   Gastrointestinal: Negative for nausea, vomiting, abdominal pain, diarrhea and constipation.  Genitourinary: Positive for dysuria. Negative for urgency, frequency, hematuria and flank pain.       Negative for vaginal bleeding, vaginal discharge  Musculoskeletal: Negative.   Neurological: Negative.   Psychiatric/Behavioral: Negative.    Physical Exam   Blood pressure 127/74, pulse 72, temperature 98.6 F (37 C), temperature source Oral, resp. rate 18, height 5\' 6"  (1.676 m), weight 245 lb (111.131 kg), last menstrual period 11/01/2011, SpO2 100.00%.  Physical Exam  Nursing note and vitals reviewed. Constitutional: She is oriented to person, place, and time. She appears well-developed and  well-nourished. No distress.  HENT:  Head: Normocephalic and atraumatic.  Cardiovascular: Normal rate.   Respiratory: Effort normal.  GI: Soft. Bowel sounds are normal. She exhibits no mass. There is no tenderness. There is no rebound and no guarding.  Genitourinary:    There is no rash or lesion on the right labia. There is no rash or lesion on the left labia. There is tenderness around the vagina. No bleeding around the vagina. Vaginal discharge (thin, white) found.  Musculoskeletal: Normal range of motion.  Neurological: She is alert and oriented to person, place, and time.  Skin: Skin is warm and dry.  Psychiatric: She has a normal mood and affect.    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

## 2012-03-08 ENCOUNTER — Emergency Department (HOSPITAL_COMMUNITY): Payer: Self-pay

## 2012-03-08 ENCOUNTER — Encounter (HOSPITAL_COMMUNITY): Payer: Self-pay | Admitting: Adult Health

## 2012-03-08 DIAGNOSIS — J45909 Unspecified asthma, uncomplicated: Secondary | ICD-10-CM | POA: Insufficient documentation

## 2012-03-08 DIAGNOSIS — Z79899 Other long term (current) drug therapy: Secondary | ICD-10-CM | POA: Insufficient documentation

## 2012-03-08 DIAGNOSIS — R0789 Other chest pain: Secondary | ICD-10-CM | POA: Insufficient documentation

## 2012-03-08 DIAGNOSIS — R0602 Shortness of breath: Secondary | ICD-10-CM | POA: Insufficient documentation

## 2012-03-08 DIAGNOSIS — R002 Palpitations: Secondary | ICD-10-CM | POA: Insufficient documentation

## 2012-03-08 LAB — BASIC METABOLIC PANEL
CO2: 25 mEq/L (ref 19–32)
Calcium: 9.2 mg/dL (ref 8.4–10.5)
Creatinine, Ser: 0.73 mg/dL (ref 0.50–1.10)
GFR calc Af Amer: >90 mL/min (ref >90)

## 2012-03-08 LAB — CBC
MCH: 29.5 pg (ref 26.0–34.0)
MCV: 89.2 fL (ref 78.0–100.0)
Platelets: 194 10*3/uL (ref 150–400)
RDW: 14.9 % (ref 11.5–15.5)

## 2012-03-08 LAB — URINALYSIS, ROUTINE W REFLEX MICROSCOPIC
Glucose, UA: NEGATIVE mg/dL
Ketones, ur: NEGATIVE mg/dL
Leukocytes, UA: NEGATIVE
Protein, ur: NEGATIVE mg/dL

## 2012-03-08 MED ORDER — ALBUTEROL SULFATE (5 MG/ML) 0.5% IN NEBU
5.0000 mg | INHALATION_SOLUTION | Freq: Once | RESPIRATORY_TRACT | Status: AC
  Administered 2012-03-09: 5 mg via RESPIRATORY_TRACT
  Filled 2012-03-08: qty 1

## 2012-03-08 NOTE — ED Notes (Signed)
Reports chest tightness, SOB, sore throat and headache since yesterday and lower abdominal pain that began 3 days that radiates to vagina and down legs... PT reports being out of inhaler. Bilateral lung sounds diminished lower lobes, clear  upper. Able to speak in full sentences. sats 100% RA.  chest pain is worse with deep inspiration.

## 2012-03-09 ENCOUNTER — Emergency Department (HOSPITAL_COMMUNITY): Payer: Self-pay

## 2012-03-09 ENCOUNTER — Emergency Department (HOSPITAL_COMMUNITY)
Admission: EM | Admit: 2012-03-09 | Discharge: 2012-03-09 | Disposition: A | Discharge status: Home / Self Care | Payer: Self-pay | Attending: Emergency Medicine | Admitting: Emergency Medicine

## 2012-03-09 ENCOUNTER — Encounter (HOSPITAL_COMMUNITY): Payer: Self-pay | Admitting: Radiology

## 2012-03-09 DIAGNOSIS — J45909 Unspecified asthma, uncomplicated: Secondary | ICD-10-CM

## 2012-03-09 LAB — POCT I-STAT TROPONIN I

## 2012-03-09 LAB — D-DIMER, QUANTITATIVE: D-Dimer, Quant: 0.83 ug/mL-FEU — ABNORMAL HIGH (ref 0.00–0.48)

## 2012-03-09 MED ORDER — IOHEXOL 350 MG/ML SOLN
100.0000 mL | Freq: Once | INTRAVENOUS | Status: AC | PRN
  Administered 2012-03-09: 100 mL via INTRAVENOUS

## 2012-03-09 MED ORDER — PREDNISONE 20 MG PO TABS
60.0000 mg | ORAL_TABLET | Freq: Once | ORAL | Status: AC
  Administered 2012-03-09: 60 mg via ORAL
  Filled 2012-03-09: qty 3

## 2012-03-09 MED ORDER — ALBUTEROL SULFATE HFA 108 (90 BASE) MCG/ACT IN AERS: 2.0000 | INHALATION_SPRAY | RESPIRATORY_TRACT | Status: DC | PRN

## 2012-03-09 MED ORDER — ALBUTEROL SULFATE (5 MG/ML) 0.5% IN NEBU
2.5000 mg | INHALATION_SOLUTION | Freq: Once | RESPIRATORY_TRACT | Status: AC
  Administered 2012-03-09: 2.5 mg via RESPIRATORY_TRACT
  Filled 2012-03-09: qty 0.5

## 2012-03-09 MED ORDER — IPRATROPIUM BROMIDE 0.02 % IN SOLN
0.5000 mg | Freq: Once | RESPIRATORY_TRACT | Status: AC
  Administered 2012-03-09: 0.5 mg via RESPIRATORY_TRACT
  Filled 2012-03-09: qty 2.5

## 2012-03-09 NOTE — ED Provider Notes (Signed)
History     CSN: [id number]  Arrival date & time 03/08/12  2057   First MD Initiated Contact with Patient 03/09/12 0146      Chief Complaint  Patient presents with  . Shortness of Breath    (Consider location/radiation/quality/duration/timing/severity/associated sxs/prior treatment) HPI Comments: Patient complains of 3 days of palpitations, shortness of breath, chest tightness and wheezing. She states she has a history of asthma and is out of her inhaler. She denies any fevers, vomiting. Tight chest tightness is worse with deep breathing and associated with dry cough and sore throat. Contrary to triage note she denies abdominal pain. She had similar symptoms 12 years ago and she traveled to [place] but has had no recent travel. Denies any history of blood clots. No leg pain or swelling.  The history is provided by the patient.    Past Medical History  Diagnosis Date  . Infertility associated with anovulation   . Asthma     Past Surgical History  Procedure Date  . Pelvic laparoscopy     History reviewed. No pertinent family history.  History  Substance Use Topics  . Smoking status: Never Smoker   . Smokeless tobacco: Never Used  . Alcohol Use: No    OB History    Grav Para Term Preterm Abortions TAB SAB Ect Mult Living   0               Review of Systems  Constitutional: Negative for fever, activity change and appetite change.  HENT: Positive for congestion and rhinorrhea.   Respiratory: Positive for cough, chest tightness and shortness of breath.   Cardiovascular: Positive for palpitations. Negative for chest pain.  Gastrointestinal: Negative for nausea, vomiting and abdominal pain.  Genitourinary: Negative for dysuria, hematuria, vaginal bleeding and vaginal discharge.  Musculoskeletal: Negative for back pain.  Neurological: Negative for headaches.    Allergies  Review of patient's allergies indicates no known allergies.  Home Medications   Current  Outpatient Rx  Name Route Sig Dispense Refill  . ACETAMINOPHEN 325 MG PO TABS Oral Take 650 mg by mouth every 6 (six) hours as needed. For headache    . ALBUTEROL SULFATE HFA 108 (90 BASE) MCG/ACT IN AERS Inhalation Inhale 2 puffs into the lungs every 6 (six) hours as needed. For shortness of breath and wheezing    . IBUPROFEN 600 MG PO TABS Oral Take 600 mg by mouth every 6 (six) hours as needed. For pain    . ALBUTEROL SULFATE HFA 108 (90 BASE) MCG/ACT IN AERS Inhalation Inhale 2 puffs into the lungs every 4 (four) hours as needed for wheezing. 1 Inhaler 0    BP 120/46  Pulse 98  Temp 97.9 F (36.6 C) (Oral)  Resp 18  SpO2 99%  LMP 02/23/2012  Physical Exam  Constitutional: She is oriented to person, place, and time. She appears well-developed and well-nourished. No distress.  HENT:  Head: Normocephalic and atraumatic.  Mouth/Throat: Oropharynx is clear and moist. No oropharyngeal exudate.  Eyes: Conjunctivae are normal.  Neck: Normal range of motion. Neck supple.  Cardiovascular: Normal rate, regular rhythm and normal heart sounds.   No murmur heard. Pulmonary/Chest: Effort normal. No respiratory distress. She has wheezes.       Good air exchange, scattered wheezes  Abdominal: There is no tenderness. There is no rebound and no guarding.  Musculoskeletal: Normal range of motion. She exhibits no edema and no tenderness.  Neurological: She is alert and oriented to person, place, and  time. No cranial nerve deficit.  Skin: Skin is warm.    ED Course  Procedures (including critical care time)  Labs Reviewed  D-DIMER, QUANTITATIVE - Abnormal; Notable for the following:    D-Dimer, Quant 0.83 (*)     All other components within normal limits  CBC  BASIC METABOLIC PANEL  URINALYSIS, ROUTINE W REFLEX MICROSCOPIC  POCT PREGNANCY, URINE  POCT I-STAT TROPONIN I  POCT I-STAT TROPONIN I   Dg Chest 2 View  03/08/2012  *RADIOLOGY REPORT*  Clinical Data: Short of breath.  CHEST - 2  VIEW  Comparison: 01/08/2011.  Findings:  Cardiopericardial silhouette within normal limits. Mediastinal contours normal. Trachea midline.  No airspace disease or effusion.  Lung volumes are low, but similar to prior.  IMPRESSION: Low volume chest with basilar atelectasis.  No acute cardiopulmonary disease or interval change.  Original Report Authenticated By: Andreas Newport, M.D.   Ct Angio Chest W/cm &/or Wo Cm  03/09/2012  *RADIOLOGY REPORT*  Clinical Data: Short of breath.  Pulmonary embolism.  Chest tightness.  CT ANGIOGRAPHY CHEST  Technique:  Multidetector CT imaging of the chest using the standard protocol during bolus administration of intravenous contrast. Multiplanar reconstructed images including MIPs were obtained and reviewed to evaluate the vascular anatomy.  Contrast: OMNIPAQUE IOHEXOL 350 MG/ML SOLN  Comparison: 03/08/2012 chest radiograph.  Findings: Substantial respiratory motion artifact is present on the examination and this precludes full evaluation of the pulmonary arteries.  There is also bolus dispersion.  There is no central pulmonary embolus identified.  The heart is grossly normal.  No effusion.  No axillary, mediastinal, or hilar adenopathy is identified. Incidental imaging of the upper abdomen is within normal limits. There are no aggressive osseous lesions identified.  The bones are within normal limits.  IMPRESSION:  1.  No central pulmonary embolus.  Full evaluation precluded by motion from breathing and dispersion of the contrast bolus. 2.  No acute abnormality.  Atelectasis of the lungs without airspace disease.  Original Report Authenticated By: Andreas Newport, M.D.     1. Asthma       MDM  Palpitations and shortness of breath for the past 3 days it feels like her asthma. Some chest tightness. No cough or fever. Patient reports being out of her inhaler.  Chest x-ray negative. Patient given nebulizer with good result. Ambulatory in the ED with no  desaturations.     Date: 03/09/2012  Rate: 75  Rhythm: normal sinus rhythm  QRS Axis: normal  Intervals: normal  ST/T Wave abnormalities: normal  Conduction Disutrbances:none  Narrative Interpretation:   Old EKG Reviewed: unchanged    Glynn Octave, MD 03/09/12 [phone]

## 2012-11-01 ENCOUNTER — Emergency Department (HOSPITAL_COMMUNITY)
Admission: EM | Admit: 2012-11-01 | Discharge: 2012-11-01 | Disposition: A | Discharge status: Home / Self Care | Payer: Self-pay | Attending: Emergency Medicine | Admitting: Emergency Medicine

## 2012-11-01 ENCOUNTER — Emergency Department (HOSPITAL_COMMUNITY): Payer: Self-pay

## 2012-11-01 DIAGNOSIS — Z8742 Personal history of other diseases of the female genital tract: Secondary | ICD-10-CM | POA: Insufficient documentation

## 2012-11-01 DIAGNOSIS — Z79899 Other long term (current) drug therapy: Secondary | ICD-10-CM | POA: Insufficient documentation

## 2012-11-01 DIAGNOSIS — Y9389 Activity, other specified: Secondary | ICD-10-CM | POA: Insufficient documentation

## 2012-11-01 DIAGNOSIS — S6992XA Unspecified injury of left wrist, hand and finger(s), initial encounter: Secondary | ICD-10-CM

## 2012-11-01 DIAGNOSIS — S6980XA Other specified injuries of unspecified wrist, hand and finger(s), initial encounter: Secondary | ICD-10-CM | POA: Insufficient documentation

## 2012-11-01 DIAGNOSIS — J45909 Unspecified asthma, uncomplicated: Secondary | ICD-10-CM | POA: Insufficient documentation

## 2012-11-01 DIAGNOSIS — S6990XA Unspecified injury of unspecified wrist, hand and finger(s), initial encounter: Secondary | ICD-10-CM | POA: Insufficient documentation

## 2012-11-01 DIAGNOSIS — Y929 Unspecified place or not applicable: Secondary | ICD-10-CM | POA: Insufficient documentation

## 2012-11-01 MED ORDER — IBUPROFEN 800 MG PO TABS: 800.0000 mg | ORAL_TABLET | Freq: Three times a day (TID) | ORAL | Status: DC

## 2012-11-01 MED ORDER — IBUPROFEN 400 MG PO TABS
800.0000 mg | ORAL_TABLET | Freq: Once | ORAL | Status: AC
  Administered 2012-11-01: 800 mg via ORAL
  Filled 2012-11-01: qty 2

## 2012-11-01 NOTE — ED Notes (Signed)
Pt reports injuring left middle finger while opening car door 4 days ago.  Pt reports swelling has increased and pain 10/10 and shoots to shoulder.  Pt is able to move the digit but states it is painful.  Slight swelling noted no redness.  RN does note slight deformity in angle of.  Pt alert oriented X4

## 2012-11-01 NOTE — ED Provider Notes (Signed)
History     CSN: [id number]  Arrival date & time 11/01/12  1059   First MD Initiated Contact with Patient 11/01/12 1128      Chief Complaint  Patient presents with  . Finger Injury    (Consider location/radiation/quality/duration/timing/severity/associated sxs/prior treatment) HPI Comments: Patient is a 42 year old female who presents with a 4 day history of left middle finger pain. The pain started suddenly after she slammed her finger in a car door. The pain is throbbing and severe with radiation to her shoulder. Patient reports associated swelling. Patient has not tried anything for symptoms. Movement and palpation makes the pain worse. No alleviating factors. Patient denies any other injuries.    Past Medical History  Diagnosis Date  . Infertility associated with anovulation   . Asthma     Past Surgical History  Procedure Laterality Date  . Pelvic laparoscopy      No family history on file.  History  Substance Use Topics  . Smoking status: Never Smoker   . Smokeless tobacco: Never Used  . Alcohol Use: No    OB History   Grav Para Term Preterm Abortions TAB SAB Ect Mult Living   0               Review of Systems  Musculoskeletal: Positive for arthralgias.  All other systems reviewed and are negative.    Allergies  Review of patient's allergies indicates no known allergies.  Home Medications   Current Outpatient Rx  Name  Route  Sig  Dispense  Refill  . acetaminophen (TYLENOL) 325 MG tablet   Oral   Take 650 mg by mouth every 6 (six) hours as needed. For headache         . ibuprofen (ADVIL,MOTRIN) 600 MG tablet   Oral   Take 600 mg by mouth every 6 (six) hours as needed. For pain         . albuterol (PROVENTIL HFA;VENTOLIN HFA) 108 (90 BASE) MCG/ACT inhaler   Inhalation   Inhale 2 puffs into the lungs every 6 (six) hours as needed. For shortness of breath and wheezing           BP 126/70  Pulse 76  Temp(Src) 98 F (36.7 C) (Oral)  Resp  18  SpO2 97%  LMP 10/18/2012  Physical Exam  Nursing note and vitals reviewed. Constitutional: She is oriented to person, place, and time. She appears well-developed and well-nourished. No distress.  HENT:  Head: Normocephalic and atraumatic.  Eyes: Conjunctivae are normal.  Cardiovascular: Normal rate, regular rhythm and intact distal pulses.  Exam reveals no gallop and no friction rub.   No murmur heard. Pulmonary/Chest: Effort normal and breath sounds normal. She has no wheezes. She has no rales. She exhibits no tenderness.  Abdominal: Soft. There is no tenderness.  Musculoskeletal: Normal range of motion.  Left middle finger PIP edematous and tender to palpation. Full ROM of affected finger.   Neurological: She is alert and oriented to person, place, and time. Coordination normal.  Speech is goal-oriented. Moves limbs without ataxia.   Skin: Skin is warm and dry.  Psychiatric: She has a normal mood and affect. Her behavior is normal.    ED Course  Procedures (including critical care time)  Labs Reviewed - No data to display Dg Hand Complete Left  11/01/2012  *RADIOLOGY REPORT*  Clinical Data: Pain and swelling in the left hand following injury 2 weeks ago.  LEFT HAND - COMPLETE 3+ VIEW  Comparison: None.  Findings: The mineralization and alignment are normal.  There is no evidence of acute fracture or dislocation.  There appears to be mild swelling of the long finger.  No foreign bodies are identified.  IMPRESSION: No acute osseous findings.  Apparent mild swelling in the long finger.   Original Report Authenticated By: Carey Bullocks, M.D.      1. Finger injury, left, initial encounter       MDM  11:55 AM Xray unremarkable for acute changes. Patient will have ibuprofen for pain. No neurovascular compromise. Patient instructed to ice injury. No further evaluation indicated at this time. Patient instructed to return with worsening or concerning symptoms.          Emilia Beck, [place] 11/02/12 [phone]

## 2012-11-02 NOTE — ED Provider Notes (Signed)
Medical screening examination/treatment/procedure(s) were performed by non-physician practitioner and as supervising physician I was immediately available for consultation/collaboration.   Loren Racer, MD 11/02/12 [phone]

## 2013-01-30 DIAGNOSIS — Z8742 Personal history of other diseases of the female genital tract: Secondary | ICD-10-CM | POA: Insufficient documentation

## 2013-01-30 DIAGNOSIS — R062 Wheezing: Secondary | ICD-10-CM | POA: Insufficient documentation

## 2013-01-30 DIAGNOSIS — R51 Headache: Secondary | ICD-10-CM | POA: Insufficient documentation

## 2013-01-30 DIAGNOSIS — R000 Tachycardia, unspecified: Secondary | ICD-10-CM | POA: Insufficient documentation

## 2013-01-30 DIAGNOSIS — J069 Acute upper respiratory infection, unspecified: Secondary | ICD-10-CM | POA: Insufficient documentation

## 2013-01-30 DIAGNOSIS — R509 Fever, unspecified: Secondary | ICD-10-CM | POA: Insufficient documentation

## 2013-01-30 DIAGNOSIS — J029 Acute pharyngitis, unspecified: Secondary | ICD-10-CM | POA: Insufficient documentation

## 2013-01-30 DIAGNOSIS — J45901 Unspecified asthma with (acute) exacerbation: Secondary | ICD-10-CM | POA: Insufficient documentation

## 2013-01-30 NOTE — ED Notes (Signed)
Pt states she has had sob and chest wall pain x 2 days. Wheezing in L upper lobe per EMS. Duo neb en route. 98 RA. Hx of asthma. Home neb and sudafed tried w/ no relief.

## 2013-01-31 ENCOUNTER — Emergency Department (HOSPITAL_COMMUNITY)
Admission: EM | Admit: 2013-01-31 | Discharge: 2013-01-31 | Disposition: A | Discharge status: Home / Self Care | Payer: Self-pay | Attending: Emergency Medicine | Admitting: Emergency Medicine

## 2013-01-31 ENCOUNTER — Encounter (HOSPITAL_COMMUNITY): Payer: Self-pay | Admitting: Emergency Medicine

## 2013-01-31 ENCOUNTER — Emergency Department (HOSPITAL_COMMUNITY): Payer: Self-pay

## 2013-01-31 DIAGNOSIS — J069 Acute upper respiratory infection, unspecified: Secondary | ICD-10-CM

## 2013-01-31 DIAGNOSIS — J45901 Unspecified asthma with (acute) exacerbation: Secondary | ICD-10-CM

## 2013-01-31 LAB — COMPREHENSIVE METABOLIC PANEL
ALT: 16 U/L (ref 0–35)
Alkaline Phosphatase: 66 U/L (ref 39–117)
CO2: 24 mEq/L (ref 19–32)
Chloride: 102 mEq/L (ref 96–112)
GFR calc Af Amer: >90 mL/min (ref >90)
GFR calc non Af Amer: >90 mL/min (ref >90)
Glucose, Bld: 133 mg/dL — ABNORMAL HIGH (ref 70–99)
Potassium: 3.8 mEq/L (ref 3.5–5.1)
Sodium: 136 mEq/L (ref 135–145)

## 2013-01-31 LAB — CBC WITH DIFFERENTIAL/PLATELET
Lymphocytes Relative: 40 % (ref 12–46)
Lymphs Abs: 3.3 10*3/uL (ref 0.7–4.0)
MCV: 90.4 fL (ref 78.0–100.0)
Neutro Abs: 4.1 10*3/uL (ref 1.7–7.7)
Neutrophils Relative %: 51 % (ref 43–77)
Platelets: 220 10*3/uL (ref 150–400)
RBC: 4.38 MIL/uL (ref 3.87–5.11)
WBC: 8.1 10*3/uL (ref 4.0–10.5)

## 2013-01-31 MED ORDER — PREDNISONE 20 MG PO TABS: 40.0000 mg | ORAL_TABLET | Freq: Every day | ORAL | Status: DC

## 2013-01-31 MED ORDER — IPRATROPIUM BROMIDE 0.02 % IN SOLN
0.5000 mg | Freq: Once | RESPIRATORY_TRACT | Status: AC
  Administered 2013-01-31: 0.5 mg via RESPIRATORY_TRACT
  Filled 2013-01-31: qty 2.5

## 2013-01-31 MED ORDER — ALBUTEROL SULFATE (5 MG/ML) 0.5% IN NEBU
5.0000 mg | INHALATION_SOLUTION | Freq: Once | RESPIRATORY_TRACT | Status: AC
  Administered 2013-01-31: 5 mg via RESPIRATORY_TRACT
  Filled 2013-01-31: qty 1

## 2013-01-31 MED ORDER — ALBUTEROL SULFATE HFA 108 (90 BASE) MCG/ACT IN AERS
1.0000 | INHALATION_SPRAY | RESPIRATORY_TRACT | Status: DC | PRN
  Administered 2013-01-31: 2 via RESPIRATORY_TRACT
  Filled 2013-01-31: qty 6.7

## 2013-01-31 MED ORDER — OXYMETAZOLINE HCL 0.05 % NA SOLN
1.0000 | Freq: Once | NASAL | Status: AC
  Administered 2013-01-31: 1 via NASAL
  Filled 2013-01-31: qty 15

## 2013-01-31 MED ORDER — PREDNISONE 20 MG PO TABS
60.0000 mg | ORAL_TABLET | Freq: Once | ORAL | Status: AC
  Administered 2013-01-31: 60 mg via ORAL
  Filled 2013-01-31: qty 3

## 2013-01-31 NOTE — ED Provider Notes (Signed)
History    CSN: [id number] Arrival date & time 01/30/13  2358  First MD Initiated Contact with Patient 01/31/13 0102     Chief Complaint  Patient presents with  . Shortness of Breath   (Consider location/radiation/quality/duration/timing/severity/associated sxs/prior Treatment) Patient is a 42 y.o. female presenting with shortness of breath. The history is provided by the patient.  Shortness of Breath Severity:  Moderate Onset quality:  Gradual Duration:  1 day Timing:  Constant Progression:  Worsening Chronicity:  Recurrent Context: URI   Context comment:  Started having URI sx several days ago and then yesterday started to feel SOB and wheezing Relieved by:  Nothing Worsened by:  Activity and coughing Ineffective treatments:  None tried (ran out of inhaler at home.) Associated symptoms: fever, headaches, sore throat, sputum production and wheezing   Associated symptoms: no abdominal pain and no vomiting   Associated symptoms comment:  Congestion and rhinorrhea Risk factors: no tobacco use   Risk factors comment:  Hx of asthma  Past Medical History  Diagnosis Date  . Infertility associated with anovulation   . Asthma    Past Surgical History  Procedure Laterality Date  . Pelvic laparoscopy     History reviewed. No pertinent family history. History  Substance Use Topics  . Smoking status: Never Smoker   . Smokeless tobacco: Never Used  . Alcohol Use: No   OB History   Grav Para Term Preterm Abortions TAB SAB Ect Mult Living   0              Review of Systems  Constitutional: Positive for fever.  HENT: Positive for sore throat.   Respiratory: Positive for sputum production, shortness of breath and wheezing.   Gastrointestinal: Negative for vomiting and abdominal pain.  Neurological: Positive for headaches.  All other systems reviewed and are negative.    Allergies  Review of patient's allergies indicates no known allergies.  Home Medications    Current Outpatient Rx  Name  Route  Sig  Dispense  Refill  . albuterol (PROVENTIL HFA;VENTOLIN HFA) 108 (90 BASE) MCG/ACT inhaler   Inhalation   Inhale 2 puffs into the lungs every 6 (six) hours as needed. For shortness of breath and wheezing         . acetaminophen (TYLENOL) 325 MG tablet   Oral   Take 650 mg by mouth every 6 (six) hours as needed. For headache         . predniSONE (DELTASONE) 20 MG tablet   Oral   Take 2 tablets (40 mg total) by mouth daily.   10 tablet   0    BP 131/56  Pulse 99  Temp(Src) 98.4 F (36.9 C) (Oral)  Resp 20  SpO2 100%  LMP 01/19/2013 Physical Exam  Nursing note and vitals reviewed. Constitutional: She is oriented to person, place, and time. She appears well-developed and well-nourished. No distress.  HENT:  Head: Normocephalic and atraumatic.  Right Ear: Tympanic membrane and ear canal normal.  Left Ear: Tympanic membrane and ear canal normal.  Nose: Mucosal edema and rhinorrhea present.  Mouth/Throat: Oropharynx is clear and moist.  Eyes: Conjunctivae and EOM are normal. Pupils are equal, round, and reactive to light.  Neck: Normal range of motion. Neck supple.  Cardiovascular: Regular rhythm and intact distal pulses.  Tachycardia present.   No murmur heard. Pulmonary/Chest: Effort normal. No respiratory distress. She has decreased breath sounds. She has wheezes. She has no rales.  Abdominal: Soft. She exhibits no  distension. There is no tenderness. There is no rebound and no guarding.  Musculoskeletal: Normal range of motion. She exhibits no edema and no tenderness.  Neurological: She is alert and oriented to person, place, and time.  Skin: Skin is warm and dry. No rash noted. No erythema.  Psychiatric: She has a normal mood and affect. Her behavior is normal.    ED Course  Procedures (including critical care time) Labs Reviewed  COMPREHENSIVE METABOLIC PANEL - Abnormal; Notable for the following:    Glucose, Bld 133 (*)     Total Bilirubin 0.2 (*)    All other components within normal limits  CBC WITH DIFFERENTIAL   Dg Chest 2 View  01/31/2013   *RADIOLOGY REPORT*  Clinical Data: Cough and fever.  CHEST - 2 VIEW  Comparison: 03/08/2012  Findings: Shallow inspiration. The heart size and pulmonary vascularity are normal. The lungs appear clear and expanded without focal air space disease or consolidation. No blunting of the costophrenic angles.  No pneumothorax.  Mediastinal contours appear intact.  No significant change since previous study.  IMPRESSION: No evidence of active pulmonary disease.   Original Report Authenticated By: Burman Nieves, M.D.   1. URI (upper respiratory infection)   2. Asthma exacerbation, mild     MDM   Patient with asthma exacerbation which resulted most likely from a URI. Patient complains of rhinorrhea, congestion, cough and feeling feverish and then yesterday developed shortness of breath and wheezing. Upon arrival here satting normally but wheezing and decreased breath sounds. Improvement after 2 nebs with albuterol and Atrovent. Patient given steroids. Chest x-ray and labs within normal limits. Patient sent home with prednisone.  Gwyneth Sprout, MD 01/31/13 [phone]

## 2013-01-31 NOTE — ED Notes (Signed)
Patient is alert and oriented x3.  She was given DC instructions and follow up visit instructions.  Patient gave verbal understanding. She was DC ambulatory under her own power to home.  V/S stable.  He was not showing any signs of distress on DC 

## 2013-10-14 ENCOUNTER — Encounter (HOSPITAL_COMMUNITY): Payer: Self-pay | Admitting: Emergency Medicine

## 2013-10-14 ENCOUNTER — Emergency Department (HOSPITAL_COMMUNITY)
Admission: EM | Admit: 2013-10-14 | Discharge: 2013-10-14 | Disposition: A | Discharge status: Home / Self Care | Payer: No Typology Code available for payment source | Attending: Emergency Medicine | Admitting: Emergency Medicine

## 2013-10-14 DIAGNOSIS — IMO0002 Reserved for concepts with insufficient information to code with codable children: Secondary | ICD-10-CM | POA: Insufficient documentation

## 2013-10-14 DIAGNOSIS — R52 Pain, unspecified: Secondary | ICD-10-CM | POA: Insufficient documentation

## 2013-10-14 DIAGNOSIS — J069 Acute upper respiratory infection, unspecified: Secondary | ICD-10-CM | POA: Insufficient documentation

## 2013-10-14 DIAGNOSIS — Z79899 Other long term (current) drug therapy: Secondary | ICD-10-CM | POA: Insufficient documentation

## 2013-10-14 DIAGNOSIS — R5383 Other fatigue: Secondary | ICD-10-CM

## 2013-10-14 DIAGNOSIS — J310 Chronic rhinitis: Secondary | ICD-10-CM

## 2013-10-14 DIAGNOSIS — R5381 Other malaise: Secondary | ICD-10-CM | POA: Insufficient documentation

## 2013-10-14 DIAGNOSIS — J45909 Unspecified asthma, uncomplicated: Secondary | ICD-10-CM | POA: Insufficient documentation

## 2013-10-14 DIAGNOSIS — Z8742 Personal history of other diseases of the female genital tract: Secondary | ICD-10-CM | POA: Insufficient documentation

## 2013-10-14 MED ORDER — FLUTICASONE PROPIONATE 50 MCG/ACT NA SUSP: 2.0000 | Freq: Every day | NASAL | Status: DC

## 2013-10-14 MED ORDER — IBUPROFEN 800 MG PO TABS: 800.0000 mg | ORAL_TABLET | Freq: Three times a day (TID) | ORAL | Status: DC

## 2013-10-14 MED ORDER — ALBUTEROL SULFATE HFA 108 (90 BASE) MCG/ACT IN AERS
2.0000 | INHALATION_SPRAY | Freq: Once | RESPIRATORY_TRACT | Status: AC
  Administered 2013-10-14: 2 via RESPIRATORY_TRACT
  Filled 2013-10-14: qty 6.7

## 2013-10-14 MED ORDER — AEROCHAMBER PLUS W/MASK MISC
1.0000 | Freq: Once | Status: AC
  Administered 2013-10-14: 1
  Filled 2013-10-14: qty 1

## 2013-10-14 MED ORDER — NAPHAZOLINE-PHENIRAMINE 0.025-0.3 % OP SOLN: 1.0000 [drp] | Freq: Four times a day (QID) | OPHTHALMIC | Status: DC | PRN

## 2013-10-14 MED ORDER — IBUPROFEN 400 MG PO TABS
800.0000 mg | ORAL_TABLET | Freq: Once | ORAL | Status: AC
  Administered 2013-10-14: 800 mg via ORAL
  Filled 2013-10-14: qty 2

## 2013-10-14 NOTE — ED Provider Notes (Signed)
CSN: [id number]     Arrival date & time 10/14/13  1433 History   First MD Initiated Contact with Patient 10/14/13 1502     Chief Complaint  Patient presents with  . Generalized Body Aches  . Cough  . Nasal Congestion     (Consider location/radiation/quality/duration/timing/severity/associated sxs/prior Treatment) The history is provided by the patient and medical records. No language interpreter was used.    [name] is a 43 y.o. female  with a hx of asthma presents to the Emergency Department complaining of gradual, persistent, progressively worsening rhinorrhea, cough, generalized myalgias, fatigue onset 3 days ago.  She reports she has not taken any medications for this.  She denies having an inhaler at home.  Nothing makes it better or worse.  Pt denies fever, chills, neck pain, neck stiffness, abd pain, N/V/D, weakness, dizziness, syncope, dysuria.  Pt denies sick contacts, but has been in a hair salon lately.      Past Medical History  Diagnosis Date  . Infertility associated with anovulation   . Asthma    Past Surgical History  Procedure Laterality Date  . Pelvic laparoscopy     No family history on file. History  Substance Use Topics  . Smoking status: Never Smoker   . Smokeless tobacco: Never Used  . Alcohol Use: No   OB History   Grav Para Term Preterm Abortions TAB SAB Ect Mult Living   0              Review of Systems  Constitutional: Positive for fatigue. Negative for fever, chills and appetite change.  HENT: Positive for congestion, postnasal drip, rhinorrhea and sinus pressure. Negative for ear discharge, ear pain, mouth sores and sore throat.   Eyes: Negative for visual disturbance.  Respiratory: Positive for chest tightness. Negative for cough, shortness of breath, wheezing and stridor.   Cardiovascular: Negative for chest pain, palpitations and leg swelling.  Gastrointestinal: Negative for nausea, vomiting, abdominal pain and diarrhea.   Genitourinary: Negative for dysuria, urgency, frequency and hematuria.  Musculoskeletal: Positive for myalgias. Negative for arthralgias, back pain and neck stiffness.  Skin: Negative for rash.  Neurological: Positive for headaches. Negative for syncope, light-headedness and numbness.  Hematological: Negative for adenopathy.  Psychiatric/Behavioral: The patient is not nervous/anxious.   All other systems reviewed and are negative.      Allergies  Review of patient's allergies indicates no known allergies.  Home Medications   Current Outpatient Rx  Name  Route  Sig  Dispense  Refill  . cabergoline (DOSTINEX) 0.5 MG tablet   Oral   Take 0.5 mg by mouth once a week. Every Sunday for 2 months - last dose 10/14/13         . Naproxen Sodium (FLANAX PAIN RELIEF PO)   Oral   Take 2 tablets by mouth once.         . fluticasone (FLONASE) 50 MCG/ACT nasal spray   Each Nare   Place 2 sprays into both nostrils daily.   16 g   2   . naphazoline-pheniramine (NAPHCON-A) 0.025-0.3 % ophthalmic solution   Both Eyes   Place 1 drop into both eyes every 6 (six) hours as needed for irritation.   5 mL   0    BP 124/78  Pulse 82  Temp(Src) 97.9 F (36.6 C) (Oral)  Resp 20  SpO2 100%  LMP 09/20/2013 Physical Exam  Constitutional: She is oriented to person, place, and time. She appears well-developed and well-nourished. No  distress.  HENT:  Head: Normocephalic and atraumatic.  Right Ear: Tympanic membrane, external ear and ear canal normal.  Left Ear: Tympanic membrane, external ear and ear canal normal.  Nose: Mucosal edema and rhinorrhea present. No epistaxis. Right sinus exhibits no maxillary sinus tenderness and no frontal sinus tenderness. Left sinus exhibits no maxillary sinus tenderness and no frontal sinus tenderness.  Mouth/Throat: Uvula is midline, oropharynx is clear and moist and mucous membranes are normal. Mucous membranes are not pale and not cyanotic. No uvula  swelling. No oropharyngeal exudate, posterior oropharyngeal edema, posterior oropharyngeal erythema or tonsillar abscesses.  Eyes: Conjunctivae are normal. Pupils are equal, round, and reactive to light.  Neck: Normal range of motion and full passive range of motion without pain. Neck supple.  Cardiovascular: Normal rate, regular rhythm, normal heart sounds and intact distal pulses.   No murmur heard. Pulmonary/Chest: Effort normal. No accessory muscle usage or stridor. Not tachypneic. No respiratory distress. She has decreased breath sounds. She has no wheezes. She has no rhonchi. She has no rales. She exhibits no tenderness.  Mildly decreased BS throughout, but no wheezes, rhonchi or rales  Abdominal: Soft. Bowel sounds are normal. She exhibits no distension. There is no tenderness.  Musculoskeletal: Normal range of motion. She exhibits no edema.  Lymphadenopathy:       Head (right side): No submental, no submandibular, no tonsillar, no preauricular, no posterior auricular and no occipital adenopathy present.       Head (left side): No submental, no submandibular, no tonsillar, no preauricular, no posterior auricular and no occipital adenopathy present.    She has no cervical adenopathy.       Right cervical: No superficial cervical, no deep cervical and no posterior cervical adenopathy present.      Left cervical: No superficial cervical, no deep cervical and no posterior cervical adenopathy present.  No lymphadenopathy  Neurological: She is alert and oriented to person, place, and time.  Skin: Skin is warm and dry. No rash noted. She is not diaphoretic. No erythema.  Psychiatric: She has a normal mood and affect.    ED Course  Procedures (including critical care time) Labs Review Labs Reviewed - No data to display Imaging Review No results found.   EKG Interpretation None      MDM   Final diagnoses:  Viral URI  Rhinitis   [name] presents with complaints of 3 days  of URI symptoms.  Pt with rhinorrhea on exam and slightly diminished breath sounds, but no wheezes, rhonchi or rales.  Pt is afebrile and non tachycardic.  She denies SOB and is in no distress at this time.  Respiratory effort normal on exam.  Will give albuterol inhaler for asthma, but no wheezing and no current clinical indication of asthma exacerbation.  Her primary complaints are rhinorrhea and nasal congestion.    Pt with clear and equal breath sounds, no fever or tachycardia.   Pt reports cough is only intermittent and not bothersome to her.  Highly doubt pneumonia at this time; no indication for an x-ray.  Patients symptoms are consistent with URI, likely viral etiology. She also endorses itching eyes but denies sneezing.  Will give Flonase and naphazoline.  Discussed that antibiotics are not indicated for viral infections. Pt will be discharged with symptomatic treatment.  Verbalizes understanding and is agreeable with plan. Pt is hemodynamically stable & in NAD prior to dc.  It has been determined that no acute conditions requiring further emergency intervention are present  at this time. The patient/guardian have been advised of the diagnosis and plan. We have discussed signs and symptoms that warrant return to the ED, such as changes or worsening in symptoms.   Vital signs are stable at discharge.   BP 124/78  Pulse 82  Temp(Src) 97.9 F (36.6 C) (Oral)  Resp 20  SpO2 100%  LMP 09/20/2013  Patient/guardian has voiced understanding and agreed to follow-up with the PCP or specialist.       Dierdre ForthHannah Nakeshia Waldeck, PA-C 10/14/13 1544  Dierdre ForthHannah Shada Nienaber, PA-C 10/14/13 1544

## 2013-10-14 NOTE — Discharge Instructions (Signed)
1. Medications: flonase, naphazoline, albuterol inhaler, usual home medications 2. Treatment: rest, drink plenty of fluids, tylenol and motrin for body aches 3. Follow Up: Please followup with your primary doctor for discussion of your diagnoses and further evaluation after today's visit; if you do not have a primary care doctor use the resource guide provided to find one;     Allergic Rhinitis Allergic rhinitis is when the mucous membranes in the nose respond to allergens. Allergens are particles in the air that cause your body to have an allergic reaction. This causes you to release allergic antibodies. Through a chain of events, these eventually cause you to release histamine into the blood stream. Although meant to protect the body, it is this release of histamine that causes your discomfort, such as frequent sneezing, congestion, and an itchy, runny nose.  CAUSES  Seasonal allergic rhinitis (hay fever) is caused by pollen allergens that may come from grasses, trees, and weeds. Year-round allergic rhinitis (perennial allergic rhinitis) is caused by allergens such as house dust mites, pet dander, and mold spores.  SYMPTOMS   Nasal stuffiness (congestion).  Itchy, runny nose with sneezing and tearing of the eyes. DIAGNOSIS  Your health care provider can help you determine the allergen or allergens that trigger your symptoms. If you and your health care provider are unable to determine the allergen, skin or blood testing may be used. TREATMENT  Allergic Rhinitis does not have a cure, but it can be controlled by:  Medicines and allergy shots (immunotherapy).  Avoiding the allergen. Hay fever may often be treated with antihistamines in pill or nasal spray forms. Antihistamines block the effects of histamine. There are over-the-counter medicines that may help with nasal congestion and swelling around the eyes. Check with your health care provider before taking or giving this medicine.  If  avoiding the allergen or the medicine prescribed do not work, there are many new medicines your health care provider can prescribe. Stronger medicine may be used if initial measures are ineffective. Desensitizing injections can be used if medicine and avoidance does not work. Desensitization is when a patient is given ongoing shots until the body becomes less sensitive to the allergen. Make sure you follow up with your health care provider if problems continue. HOME CARE INSTRUCTIONS It is not possible to completely avoid allergens, but you can reduce your symptoms by taking steps to limit your exposure to them. It helps to know exactly what you are allergic to so that you can avoid your specific triggers. SEEK MEDICAL CARE IF:   You have a fever.  You develop a cough that does not stop easily (persistent).  You have shortness of breath.  You start wheezing.  Symptoms interfere with normal daily activities. Document Released: 04/06/2001 Document Revised: 05/02/2013 Document Reviewed: 03/19/2013 [facility] Patient Information 2014 [place], [place]. Upper Respiratory Infection, Adult An upper respiratory infection (URI) is also known as the common cold. It is often caused by a type of germ (virus). Colds are easily spread (contagious). You can pass it to others by kissing, coughing, sneezing, or drinking out of the same glass. Usually, you get better in 1 or 2 weeks.  HOME CARE   Only take medicine as told by your doctor.  Use a warm mist humidifier or breathe in steam from a hot shower.  Drink enough water and fluids to keep your pee (urine) clear or pale yellow.  Get plenty of rest.  Return to work when your temperature is back to normal  or as told by your doctor. You may use a face mask and wash your hands to stop your cold from spreading. GET HELP RIGHT AWAY IF:   After the first few days, you feel you are getting worse.  You have questions about your medicine.  You have chills,  shortness of breath, or brown or red spit (mucus).  You have yellow or brown snot (nasal discharge) or pain in the face, especially when you bend forward.  You have a fever, puffy (swollen) neck, pain when you swallow, or white spots in the back of your throat.  You have a bad headache, ear pain, sinus pain, or chest pain.  You have a high-pitched whistling sound when you breathe in and out (wheezing).  You have a lasting cough or cough up blood.  You have sore muscles or a stiff neck. MAKE SURE YOU:   Understand these instructions.  Will watch your condition.  Will get help right away if you are not doing well or get worse. Document Released: 12/29/2007 Document Revised: 10/04/2011 Document Reviewed: 11/16/2010 [facility] Patient Information 2014 [place], [place].    Emergency Department Resource Guide 1) Find a Doctor and Pay Out of Pocket Although you won't have to find out who is covered by your insurance plan, it is a good idea to ask around and get recommendations. You will then need to call the office and see if the doctor you have chosen will accept you as a new patient and what types of options they offer for patients who are self-pay. Some doctors offer discounts or will set up payment plans for their patients who do not have insurance, but you will need to ask so you aren't surprised when you get to your appointment.  2) Contact Your Local Health Department Not all health departments have doctors that can see patients for sick visits, but many do, so it is worth a call to see if yours does. If you don't know where your local health department is, you can check in your phone book. The CDC also has a tool to help you locate your state's health department, and many state websites also have listings of all of their local health departments.  3) Find a Walk-in Clinic If your illness is not likely to be very severe or complicated, you may want to try a walk in clinic. These are  popping up all over the country in pharmacies, drugstores, and shopping centers. They're usually staffed by nurse practitioners or physician assistants that have been trained to treat common illnesses and complaints. They're usually fairly quick and inexpensive. However, if you have serious medical issues or chronic medical problems, these are probably not your best option.  No Primary Care Doctor: - Call Health Connect at  [phone] - they can help you locate a primary care doctor that  accepts your insurance, provides certain services, etc. - Physician Referral Service- [phone]  Chronic Pain Problems: Organization         Address  Phone   Notes  [facility]  [phone] Patients need to be referred by their primary care doctor.   Medication Assistance: Organization         Address  Phone   Notes  [facility] [address] [place], [place] [address] [phone] --Must be a resident of [facility] -- Must have NO insurance coverage whatsoever (no Medicaid/ Medicare, etc.) -- The pt. MUST have a primary care  doctor that directs their care regularly and follows them in the community   [facility]ist  [phone]   [facility]  [phone]    Agencies that provide inexpensive medical care: Organization         Address  Phone   Notes  [facility]  [phone]   [facility]    [phone]   [facility] [address] [place], [place] [address] [phone]   [facility] [address] [place], [place] [phone]   Planned Parenthood    [phone]   [facility]    [phone]   [facility]  [address], [place] Phone:  [phone], Fax:  [phone] Hours of Operation:  9 am - 6 pm, M-F.  Also accepts Medicaid/Medicare and self-pay.  [facility]  [address], [place] Phone: [phone], Fax: [phone]. Hours of Operation:  8:30 am - 5:30 pm, M-F.  Also accepts Medicaid and self-pay.  [facility] [place] [address] [place] [address], [place] Phone: [phone]   [facility] [address] Natasha Bence [place], [place] [phone], Ext. [phone] Mondays & Thursdays: 7-9 AM.  First 15 patients are seen on a first come, first serve basis.    Medicaid-accepting [facility] Providers:  Organization         Address  Phone   Notes  [facility] [address], [place] [phone] Also accepts self-pay patients.  [facility] [address] Laurell Josephs [place], [place]  [phone]   [facility] [address], [place] [phone]   [facility] [address] [place] [phone]   Renaye Rakers [address], [place]   [phone] Only accepts [place] patients after they have their name applied to their card.   Self-Pay (no insurance) in [facility]:  Organization         Address  Phone   Notes  Sickle Cell Patients, [facility] [address] [place], [place] [phone]   [facility] [address] [place], [place] [phone]   [facility] [place]  [address],  [phone]   [facility]/Dr. Osei-Bonsu  [address] [place] or [address], [place] [phone] Phone number for both [place] and [place] locations is the same.  [facility] [address], [place] [phone]   [facility] [address] [place] or [address] [phone]   [facility] [address] [place] [phone], phone; [phone], fax Sees patients 1st and 3rd Saturday  of every month.  Must not qualify for public or private insurance (i.e. Medicaid, Medicare, [place] Health Choice, Veterans' Benefits)  Household income should be no more than 200% of the poverty level The clinic cannot treat you if you are pregnant or think you are pregnant  Sexually transmitted diseases are not treated at the clinic.    Dental Care: Organization         Address  Phone  Notes  [facility] Department of [facility] [address] [place], [place] [phone] Accepts  children up to age 55 who are enrolled in Medicaid or [place] Health Choice; pregnant women with a Medicaid card; and children who have applied for Medicaid or [place] Health Choice, but were declined, whose parents can pay a reduced fee at time of service.  [facility]  [address], [place] [phone] Accepts children up to age 42 who are enrolled in [place] or [place] Health Choice; pregnant women with a Medicaid card; and children who have applied for Medicaid or [place] Health Choice, but were declined, whose parents can pay a reduced fee at time of service.  [facility]  [address] [place], [place] [phone] Patients are seen by appointment only. Walk-ins are not accepted. [facility] will see patients 56 years of age and older. Monday - Tuesday (8am-5pm) Most Wednesdays (8:30-5pm) $30 per visit, cash only  [facility] Adult Dental Access PRO[facility]  [address], [facility] [phone] Patients are seen by appointment only. Walk-ins are not accepted. [facility] will see patients 51 years of age and older. One Wednesday Evening (Monthly: Volunteer Based).  $30 per visit, cash only  [employer]  [phone] for adults; Children under age 17, call Graduate Pediatric Dentistry at [phone]. Children aged 21-14, please call [phone] to request a pediatric application.   Dental services are provided in all areas of dental care including fillings, crowns and bridges, complete and partial dentures, implants, gum treatment, root canals, and extractions. Preventive care is also provided. Treatment is provided to both adults and children. Patients are selected via a lottery and there is often a waiting list.   [facility] [address] [place]  [phone] [email or web address]   [facility] [address] [place], [place] [phone], Ext. [phone] Second and Fourth Thursday of each month, opens at 6:30 AM; Clinic ends at 9 AM.  Patients are seen on a first-come first-served basis, and a limited number are seen during each clinic.   [facility]  [address] Ether Griffins [place], [place] [phone]   Eligibility Requirements You must have lived in [place], [place], or [place] counties for at least the last three months.   You cannot be eligible for state or federal sponsored [place], including [employer], [place], or [employer].   You generally cannot be eligible for healthcare insurance through your employer.    How to apply: Eligibility screenings are held every Tuesday and Wednesday afternoon from 1:00 pm until 4:00 pm. You do not need an appointment for the interview!  [facility] [address] [place], [place] [phone]   [facility] Health Department  [phone]   [facility] Health Department  [phone]   [facility] Health Department  [phone]    Behavioral Health Resources in the Community: Intensive Outpatient Programs Organization         Address  Phone  Notes  [facility] [address] [place], [place] [phone]   [facility] [address] [place], [place] [phone]   [facility] [address] [place], [place]  [phone]   [facility] Mental Health [address],  [place], [place] [phone] or [phone]   Substance Abuse Resources Organization         Address  Phone  Notes  [facility]  [phone]  [facility]  [phone]   [facility]  [phone]   [facility]  [phone]   Residential & Outpatient Substance Abuse Program  [phone]   Psychological Services Organization         Address  Phone  Notes  [facility]  [phone]- [phone]   [facility]  [phone]- [phone]   [facility] [address], [place] [phone] or [phone]    Mobile Crisis Teams Organization         Address  Phone  Notes  [facility], Mobile Crisis Care Unit  [phone]   [facility]  [address] [place], [place] [phone]   Doristine LocksSharon DeEsch [address] [place] [phone]    Self-Help/Support Groups Organization         Address  Phone             Notes  [facility] [place] - variety of support groups  336- [phone] Call for more information  Narcotics Anonymous (NA), [facility] [address], [place] Z[place]  2 meetings at this location   Statisticianesidential Treatment Programs Organization         Address  Phone  Notes  [facility] [address],    [place]  [phone]   [facility]  [address], [place] [phone], [place], [place] [phone]   [facility] [address], [place] [phone] Admissions: 8am-3pm M-F  [facility] [address]    [place], [place] [phone]   [facility] [address], [place], [place] [phone]   [facility] [address]  [place], [place] [phone]   [facility] - Intensive Outpatient [address], [place], [place] [phone]   [facility] ([facility] [address]  [place], [place]  [phone] or [phone]   [facility] ([facility]) [address] [place], [place] [phone] Accepts Medicaid  [facility] [address]  [place] [phone] Substance Abuse/Addiction Treatment   [facility] Behavioral Health Resources Organization         Address  Phone  Notes  [facility]  [phone]) [phone]   Angie FavaJulie Brannon, PhD [address] [place] [address] [place], [place]   [phone] or [phone](336) [phone]   [facility]   [address] [place], [place] [phone]   [facility] [address], [place], [place] [phone]) [phone] Insurance/Medicaid/sponsorship through [facility]  [facility] [address]                                    [place], [place] [phone] Therapy/tele-psych/case  [facility] [address]   [place], [place] [phone](336) [phone]    Dr. Lolly MustacheArfeen  [phone]   [facility]  [employer] [facility] 1) [address], [place] 2) [address], [place] 3)  [address], [place] [phone](336) [phone]  [phone]   [facility] Child Abuse Hotline [phone](336) [phone] or [phone](336) [phone] (After Hours)

## 2013-10-14 NOTE — ED Notes (Signed)
Pt presents to department for evaluation of runny nose, cough, fatigue and generalized body aches. Ongoing x3 days. Respirations unlabored. Pt is alert and oriented x4. No signs of distress noted.

## 2013-10-16 NOTE — ED Provider Notes (Signed)
Medical screening examination/treatment/procedure(s) were performed by non-physician practitioner and as supervising physician I was immediately available for consultation/collaboration.   EKG Interpretation None        Junius ArgyleForrest S Tanyiah Laurich, MD 10/16/13 2147

## 2014-02-04 ENCOUNTER — Other Ambulatory Visit: Payer: Self-pay | Admitting: Obstetrics and Gynecology

## 2014-02-04 DIAGNOSIS — Z1231 Encounter for screening mammogram for malignant neoplasm of breast: Secondary | ICD-10-CM

## 2014-02-15 ENCOUNTER — Encounter (INDEPENDENT_AMBULATORY_CARE_PROVIDER_SITE_OTHER): Payer: Self-pay

## 2014-02-15 ENCOUNTER — Ambulatory Visit
Admission: RE | Admit: 2014-02-15 | Discharge: 2014-02-15 | Disposition: A | Discharge status: Home / Self Care | Payer: No Typology Code available for payment source | Source: Ambulatory Visit | Attending: Obstetrics and Gynecology | Admitting: Obstetrics and Gynecology

## 2014-02-15 DIAGNOSIS — Z1231 Encounter for screening mammogram for malignant neoplasm of breast: Secondary | ICD-10-CM

## 2014-09-09 ENCOUNTER — Encounter (HOSPITAL_BASED_OUTPATIENT_CLINIC_OR_DEPARTMENT_OTHER): Payer: Self-pay | Admitting: *Deleted

## 2014-09-09 ENCOUNTER — Emergency Department (HOSPITAL_BASED_OUTPATIENT_CLINIC_OR_DEPARTMENT_OTHER)
Admission: EM | Admit: 2014-09-09 | Discharge: 2014-09-09 | Disposition: A | Discharge status: Home / Self Care | Payer: 59 | Attending: Emergency Medicine | Admitting: Emergency Medicine

## 2014-09-09 DIAGNOSIS — Z7951 Long term (current) use of inhaled steroids: Secondary | ICD-10-CM | POA: Insufficient documentation

## 2014-09-09 DIAGNOSIS — K029 Dental caries, unspecified: Secondary | ICD-10-CM | POA: Insufficient documentation

## 2014-09-09 DIAGNOSIS — Z791 Long term (current) use of non-steroidal anti-inflammatories (NSAID): Secondary | ICD-10-CM | POA: Insufficient documentation

## 2014-09-09 DIAGNOSIS — K088 Other specified disorders of teeth and supporting structures: Secondary | ICD-10-CM | POA: Diagnosis present

## 2014-09-09 DIAGNOSIS — J45909 Unspecified asthma, uncomplicated: Secondary | ICD-10-CM | POA: Insufficient documentation

## 2014-09-09 MED ORDER — BUPIVACAINE-EPINEPHRINE (PF) 0.5% -1:200000 IJ SOLN
INTRAMUSCULAR | Status: AC
  Administered 2014-09-09: 1 mL
  Filled 2014-09-09: qty 3.6

## 2014-09-09 MED ORDER — BUPIVACAINE-EPINEPHRINE 0.5% -1:200000 IJ SOLN
1.0000 mL | Freq: Once | INTRAMUSCULAR | Status: AC
  Administered 2014-09-09: 5 mg

## 2014-09-09 MED ORDER — KETOROLAC TROMETHAMINE 60 MG/2ML IM SOLN
30.0000 mg | Freq: Once | INTRAMUSCULAR | Status: AC
  Administered 2014-09-09: 30 mg via INTRAMUSCULAR
  Filled 2014-09-09: qty 2

## 2014-09-09 NOTE — Discharge Instructions (Signed)
Dental Pain °A tooth ache may be caused by cavities (tooth decay). Cavities expose the nerve of the tooth to air and hot or cold temperatures. It may come from an infection or abscess (also called a boil or furuncle) around your tooth. It is also often caused by dental caries (tooth decay). This causes the pain you are having. °DIAGNOSIS  °Your caregiver can diagnose this problem by exam. °TREATMENT  °· If caused by an infection, it may be treated with medications which kill germs (antibiotics) and pain medications as prescribed by your caregiver. Take medications as directed. °· Only take over-the-counter or prescription medicines for pain, discomfort, or fever as directed by your caregiver. °· Whether the tooth ache today is caused by infection or dental disease, you should see your dentist as soon as possible for further care. °SEEK MEDICAL CARE IF: °The exam and treatment you received today has been provided on an emergency basis only. This is not a substitute for complete medical or dental care. If your problem worsens or new problems (symptoms) appear, and you are unable to meet with your dentist, call or return to this location. °SEEK IMMEDIATE MEDICAL CARE IF:  °· You have a fever. °· You develop redness and swelling of your face, jaw, or neck. °· You are unable to open your mouth. °· You have severe pain uncontrolled by pain medicine. °MAKE SURE YOU:  °· Understand these instructions. °· Will watch your condition. °· Will get help right away if you are not doing well or get worse. °Document Released: 07/12/2005 Document Revised: 10/04/2011 Document Reviewed: 02/28/2008 °ExitCare® Patient Information ©2015 [employer]. This information is not intended to replace advice given to you by your health care provider. Make sure you discuss any questions you have with your health care provider. ° °Emergency Department Resource Guide °1) Find a Doctor and Pay Out of Pocket °Although you won't have to find out who  is covered by your insurance plan, it is a good idea to ask around and get recommendations. You will then need to call the office and see if the doctor you have chosen will accept you as a new patient and what types of options they offer for patients who are self-pay. Some doctors offer discounts or will set up payment plans for their patients who do not have insurance, but you will need to ask so you aren't surprised when you get to your appointment. ° °2) Contact Your Local Health Department °Not all health departments have doctors that can see patients for sick visits, but many do, so it is worth a call to see if yours does. If you don't know where your local health department is, you can check in your phone book. The CDC also has a tool to help you locate your state's health department, and many state websites also have listings of all of their local health departments. ° °3) Find a Walk-in Clinic °If your illness is not likely to be very severe or complicated, you may want to try a walk in clinic. These are popping up all over the country in pharmacies, drugstores, and shopping centers. They're usually staffed by nurse practitioners or physician assistants that have been trained to treat common illnesses and complaints. They're usually fairly quick and inexpensive. However, if you have serious medical issues or chronic medical problems, these are probably not your best option. ° °No Primary Care Doctor: °- Call [facility] at  832-8000 - they can help you locate a primary   care doctor that  accepts your insurance, provides certain services, etc. °- Physician Referral Service- 1-800-533-3463 ° °Chronic Pain Problems: °Organization         Address  Phone   Notes  °[facility]  (336) 297-2271 Patients need to be referred by their primary care doctor.  ° °Medication Assistance: °Organization         Address  Phone   Notes  °[place] [facility] [address]  [address] °[place], [place] [address] (336) 641-8030 --Must be a resident of [place] °-- Must have NO insurance coverage whatsoever (no Medicaid/ Medicare, etc.) °-- The pt. MUST have a primary care doctor that directs their care regularly and follows them in the community °  °[facility]  (866) 331-1348   °[employer]  (888) 892-1162   ° °Agencies that provide inexpensive medical care: °Organization         Address  Phone   Notes  °[facility]  (336) 832-8035   °[facility]    (336) 832-7272   °[facility] [address] °[place], [place] [address] (336) 832-4777   °[facility] [address], °[place] (336) 271-4999   °[facility]    (336) 373-0678   °[facility]    (336) 272-1050   °[facility] ° [address], [place] Phone:  (336) 832-4444, Fax:  (336) 832-4440 Hours of Operation:  9 am - 6 pm, M-F.  Also accepts Medicaid/Medicare and self-pay.  °[facility] ° [address], [place] Phone: (336) 832-3150, Fax: (336) 832-3151. Hours of Operation:  8:30 am - 5:30 pm, M-F.  Also accepts Medicaid and self-pay.  °[facility] [place] [address], [place] Phone: (336) 878-6027   °[facility] [address], [place], [place] (336)723-1848, Ext. 123 Mondays & Thursdays: 7-9 AM.  First 15 patients are seen on a first come, first serve basis. °  ° °Medicaid-accepting [place] Providers: ° °Organization         Address  Phone   Notes  °[facility] [address], [place] (336) 641-2100 Also accepts self-pay patients.  °[facility] [address], [place] ° (336) 856-9996   °[facility] [address], [place] (336) 288-8857   °[facility] [address] [place] [address], [place] (336) 299-7000   °Veita Bland [address], [place]  ° (336) 373-1557 Only accepts [place] Access Medicaid patients after they have their name applied to their card.  ° °Self-Pay (no insurance) in [place]: ° °Organization         Address  Phone   Notes  °Sickle Cell Patients, [facility] [address], [place] (336) 832-1970   °[facility] [address], [place] (336) 832-4400   °[facility] ° [address], [place] (336) 992-4800   °[facility]/Dr. Osei-Bonsu ° [address] [place] [address], [place] or [address], [place] (336) 841-8500 Phone number for both [place] and [place] locations is the same.  °[facility] [address], [place] (336) 299-0000   °[facility] [address] [place] [address],  or [address] (336) 852-7530 °(336) 878-2260   °  [facility] [address], [place] (336) 350-1642, phone; (336) 294-5005, fax Sees patients 1st and 3rd Saturday of every month.  Must not qualify for public or private insurance (i.e. Medicaid, Medicare, [place] Health Choice, Veterans' Benefits) • Household income should be no more than 200% of the poverty level •The clinic cannot treat you if you are pregnant or think you are pregnant • Sexually transmitted diseases are not treated at the clinic.  ° ° °Dental Care: °Organization         Address  Phone  Notes  °[facility] [address], [place] (336) 641-6152 Accepts children up to age 21 who are enrolled in Medicaid or [place] Health Choice; pregnant women with a Medicaid card; and children who have applied for Medicaid or [place] Health Choice, but were declined, whose parents can pay a reduced fee at time of service.  °[facility] [place]  [address], [place] (336) 641-7733 Accepts children up to age 21 who are  enrolled in Medicaid or [place] Health Choice; pregnant women with a Medicaid card; and children who have applied for Medicaid or [place] Health Choice, but were declined, whose parents can pay a reduced fee at time of service.  °[facility] ° [address], [place] (336) 641-4533 Patients are seen by appointment only. Walk-ins are not accepted. [facility] will see patients 18 years of age and older. °Monday - Tuesday (8am-5pm) °Most Wednesdays (8:30-5pm) °$30 per visit, cash only  °[facility] ° [address], [place] (336) 641-4533 Patients are seen by appointment only. Walk-ins are not accepted. [facility] will see patients 18 years of age and older. °One Wednesday Evening (Monthly: Volunteer Based).  $30 per visit, cash only  °[facility]  (919) 537-3737 for adults; Children under age 4, call Graduate Pediatric Dentistry at (919) [phone]. Children aged 4-14, please call (919) [phone] to request a pediatric application. ° Dental services are provided in all areas of dental care including fillings, crowns and bridges, complete and partial dentures, implants, gum treatment, root canals, and extractions. Preventive care is also provided. Treatment is provided to both adults and children. °Patients are selected via a lottery and there is often a waiting list. °  °[facility] [address], °[place] ° (336) 763-8833 [email or web address] °  °[facility] [address], [place], [place] (336)723-1848, Ext. 123 Second and Fourth Thursday of each month, opens at 6:30 AM; Clinic ends at 9 AM.  Patients are seen on a first-come first-served basis, and a limited number are seen during each clinic.  ° °[facility] ° [address], [place], [place] (336) 723-7904   Eligibility Requirements °You must have lived in [place], [place], or [place] counties for at least the last three months. °  You  cannot be eligible for state or federal sponsored healthcare insurance, including Veterans Administration, Medicaid, or Medicare. °  You generally cannot be eligible for healthcare insurance through your employer.  °  How to apply: °Eligibility screenings are held every Tuesday and Wednesday afternoon from 1:00 pm until 4:00 pm. You do not need an appointment for the interview!  °[facility] [address], [place], [place] 336-631-2330   °[facility]  336-342-8273   °[facility]  336-703-3100   °[facility]  336-570-6415   ° °Behavioral Health Resources in the Community: °Intensive Outpatient Programs °Organization         Address  Phone  Notes  °[facility] [address], [place], [place] 336-878-6098   °[facility] [address], [place], [place] 336-832-9800   °[facility] [address], [place], [place] ° 336-882-2125   °[facility] [address],  °[place], [place] 1-800-853-5163 or 336-641-4981   °Substance Abuse Resources °Organization         Address  Phone  Notes  °[facility]  336-882-2125   °[facility]  336-784-9470   °[facility]  336-285-9073   °[facility]  336-845-3988   °Residential & Outpatient Substance Abuse Program  1-800-659-3381   °Psychological Services °Organization         Address  Phone  Notes  °[facility]  336- 832-9600   °[facility]  336- 378-7881   °[facility] [address], [place] 1-800-853-5163 or 336-641-4981   ° °Mobile Crisis Teams °Organization         Address  Phone  Notes  °[facility], Mobile Crisis Care Unit  1-877-626-1772   °Assertive °[facility] ° [address] [place], [place] 336-834-9664   °Sharon DeEsch [address] °[place] 336-554-5454   ° °Self-Help/Support  Groups °Organization         Address  Phone             Notes  °[facility] - variety of support groups  336- 373-1402 Call for more information  °Narcotics Anonymous (NA), [facility] [address], °[place]  2 meetings at this location  ° °Residential Treatment Programs °Organization         Address  Phone  Notes  °[facility] [address],    °[place]  1-866-801-8205   °[facility] ° [address], [place], [place] 704-293-8524   °[facility] [address], [place] 336-845-3988 Admissions: 8am-3pm M-F  °[facility] [address]    °[place], [place] 336-841-1104   °[facility] [address], [place], [place] 336-379-7146   °[facility] [address]  °[place], [place] 336-285-9073   °[facility] Intensive Outpatient [address], [place], [place] 336-852-3033   °[facility] [address]  °[place], [place] 1-877-615-2722 or 336-784-9470   °[facility] [address] [place], [place] 336-227-7417 Accepts Medicaid  °[facility] [address]  °[place] 1-800-659-3381 Substance Abuse/Addiction Treatment  ° °[place] Behavioral Health Resources °Organization         Address  Phone  Notes  °[facility]  (888) 581-9988   °Julie Brannon, PhD [address] [place], [place]   (336) 349-5553 or (336) 951-0000   °[facility]   [address] °[place], [place] (336) 349-4454   °[facility] [address], [place], [place] (336) 342-8316 Insurance/Medicaid/sponsorship through [facility]  °[facility] [address]                                    , [place] (336) 342-8316 Therapy/tele-psych/case  °[facility]   [address]  ° [place], [place] (336) 349-2233    °Dr. Arfeen  (336) 349-4544   °[facility] [place]  [employer] [facility] [place] [facility] 1) [address], [place] °2) [address], [place] °3)  [address], [place] (336) 349-3220 °(336) 342-7768 ° °(336) 342-8140   °[place] [facility] (336) 342-1394 or (336) 342-3537 (After Hours)    ° ° ° °

## 2014-09-09 NOTE — ED Provider Notes (Signed)
CSN: [id number]     Arrival date & time 09/09/14  0957 History   First MD Initiated Contact with Patient 09/09/14 1003     Chief Complaint  Patient presents with  . Dental Pain     (Consider location/radiation/quality/duration/timing/severity/associated sxs/prior Treatment) Patient is a 44 y.o. female presenting with tooth pain.  Dental Pain Location:  Upper and lower Upper teeth location:  15/LU 2nd molar Lower teeth location:  17/LL 3rd molar Quality:  Dull Severity:  Moderate Onset quality:  Gradual Duration:  2 days Timing:  Constant Progression:  Worsening Chronicity:  Recurrent Context: dental caries   Relieved by:  Nothing Worsened by:  Touching and hot food/drink Associated symptoms: no congestion, no difficulty swallowing, no drooling, no facial pain, no fever and no trismus     Past Medical History  Diagnosis Date  . Infertility associated with anovulation   . Asthma    Past Surgical History  Procedure Laterality Date  . Pelvic laparoscopy     No family history on file. History  Substance Use Topics  . Smoking status: Never Smoker   . Smokeless tobacco: Never Used  . Alcohol Use: No   OB History    Gravida Para Term Preterm AB TAB SAB Ectopic Multiple Living   0              Review of Systems  Constitutional: Negative for fever.  HENT: Negative for congestion and drooling.   All other systems reviewed and are negative.     Allergies  Review of patient's allergies indicates no known allergies.  Home Medications   Prior to Admission medications   Medication Sig Start Date End Date Taking? Authorizing Provider  cabergoline (DOSTINEX) 0.5 MG tablet Take 0.5 mg by mouth once a week. Every Sunday for 2 months - last dose 10/14/13    Historical Provider, MD  fluticasone (FLONASE) 50 MCG/ACT nasal spray Place 2 sprays into both nostrils daily. 10/14/13   Hannah Muthersbaugh, PA-C  ibuprofen (ADVIL,MOTRIN) 800 MG tablet Take 1 tablet (800 mg total) by  mouth 3 (three) times daily. 10/14/13   Tatyana A Kirichenko, PA-C  naphazoline-pheniramine (NAPHCON-A) 0.025-0.3 % ophthalmic solution Place 1 drop into both eyes every 6 (six) hours as needed for irritation. 10/14/13   Hannah Muthersbaugh, PA-C  Naproxen Sodium (FLANAX PAIN RELIEF PO) Take 2 tablets by mouth once.    Historical Provider, MD   BP 125/64 mmHg  Pulse 69  Temp(Src) 98.2 F (36.8 C) (Oral)  Resp 20  Ht  (1.651 m)  Wt 230 lb (104.327 kg)  BMI 38.27 kg/m2  SpO2 100%  LMP 09/02/2014 (Exact Date) Physical Exam  Constitutional: She is oriented to person, place, and time. She appears well-developed and well-nourished.  HENT:  Head: Normocephalic and atraumatic.  Right Ear: External ear normal.  Left Ear: External ear normal.  Mouth/Throat: No trismus in the jaw. Dental caries present.  Eyes: Conjunctivae and EOM are normal. Pupils are equal, round, and reactive to light.  Neck: Normal range of motion. Neck supple.  Cardiovascular: Normal rate, regular rhythm, normal heart sounds and intact distal pulses.   Pulmonary/Chest: Effort normal and breath sounds normal.  Abdominal: Soft. Bowel sounds are normal. There is no tenderness.  Musculoskeletal: Normal range of motion.  Neurological: She is alert and oriented to person, place, and time.  Skin: Skin is warm and dry.  Vitals reviewed.   ED Course  NERVE BLOCK Date/Time: 09/09/2014 10:32 AM Performed by: Mirian Mo Authorized  by: Mirian MoGENTRY, Hommer Cunliffe Consent: Verbal consent obtained. Indications: pain relief Body area: face/mouth Nerve: infraorbital Laterality: left Preparation: Patient was prepped and draped in the usual sterile fashion. Patient position: sitting Needle gauge: 27 G Location technique: anatomical landmarks Local anesthetic: bupivacaine 0.5% with epinephrine Patient tolerance: Patient tolerated the procedure well with no immediate complications  NERVE BLOCK Date/Time: 09/09/2014 10:33  AM Performed by: Mirian MoGENTRY, Gabriell Casimir Authorized by: Mirian MoGENTRY, Alante Weimann Consent: Verbal consent obtained. Indications: pain relief Body area: face/mouth Nerve: inferior alveolar Laterality: left Preparation: Patient was prepped and draped in the usual sterile fashion. Patient position: sitting Needle gauge: 27 G Local anesthetic: bupivacaine 0.5% with epinephrine   (including critical care time) Labs Review Labs Reviewed - No data to display  Imaging Review No results found.   EKG Interpretation None      MDM   Final diagnoses:  Pain due to dental caries    44 y.o. female with pertinent PMH of prior dental pain presents with recurrent dental pain as above. No infectious symptoms, fevers, other concerning findings. Physical exam today as above. No dental abscess, other acute abnormality. Patient offered a dental block accepted. Dental block 2 performed as above. Discharged home in stable condition to follow-up with dentistry..    I have reviewed all laboratory and imaging studies if ordered as above  1. Pain due to dental caries         Mirian MoMatthew Elick Aguilera, MD 09/09/14 [phone]

## 2014-09-09 NOTE — ED Notes (Signed)
Pt discharged to home.  Explained to pt that she can take Ibuprofen for pain at home.  Instructed pt that she needs to see a dentist.

## 2014-09-09 NOTE — ED Notes (Signed)
Tooth pain for the last three days.  Broken teeth with dental caries on the left upper and lower back teeth.

## 2015-01-03 ENCOUNTER — Encounter (HOSPITAL_COMMUNITY): Payer: Self-pay

## 2015-01-03 ENCOUNTER — Emergency Department (HOSPITAL_COMMUNITY): Payer: 59

## 2015-01-03 ENCOUNTER — Emergency Department (HOSPITAL_COMMUNITY)
Admission: EM | Admit: 2015-01-03 | Discharge: 2015-01-03 | Disposition: A | Discharge status: Home / Self Care | Payer: 59 | Attending: Emergency Medicine | Admitting: Emergency Medicine

## 2015-01-03 DIAGNOSIS — R109 Unspecified abdominal pain: Secondary | ICD-10-CM | POA: Insufficient documentation

## 2015-01-03 DIAGNOSIS — J45909 Unspecified asthma, uncomplicated: Secondary | ICD-10-CM | POA: Diagnosis not present

## 2015-01-03 DIAGNOSIS — R079 Chest pain, unspecified: Secondary | ICD-10-CM | POA: Diagnosis present

## 2015-01-03 DIAGNOSIS — R0789 Other chest pain: Secondary | ICD-10-CM | POA: Diagnosis not present

## 2015-01-03 LAB — COMPREHENSIVE METABOLIC PANEL
ALBUMIN: 3.5 g/dL (ref 3.5–5.0)
ALK PHOS: 75 U/L (ref 38–126)
ALT: 27 U/L (ref 14–54)
ANION GAP: 10 (ref 5–15)
AST: 29 U/L (ref 15–41)
BILIRUBIN TOTAL: 0.6 mg/dL (ref 0.3–1.2)
BUN: 11 mg/dL (ref 6–20)
CO2: 19 mmol/L — ABNORMAL LOW (ref 22–32)
Calcium: 8.8 mg/dL — ABNORMAL LOW (ref 8.9–10.3)
Chloride: 110 mmol/L (ref 101–111)
Creatinine, Ser: 0.75 mg/dL (ref 0.44–1.00)
GFR calc Af Amer: >60 mL/min (ref >60)
GFR calc non Af Amer: >60 mL/min (ref >60)
Glucose, Bld: 79 mg/dL (ref 65–99)
POTASSIUM: 4.6 mmol/L (ref 3.5–5.1)
Sodium: 139 mmol/L (ref 135–145)
TOTAL PROTEIN: 6.5 g/dL (ref 6.5–8.1)

## 2015-01-03 LAB — URINALYSIS, ROUTINE W REFLEX MICROSCOPIC
Bilirubin Urine: NEGATIVE
GLUCOSE, UA: NEGATIVE mg/dL
Hgb urine dipstick: NEGATIVE
Ketones, ur: NEGATIVE mg/dL
Leukocytes, UA: NEGATIVE
NITRITE: NEGATIVE
PROTEIN: NEGATIVE mg/dL
Specific Gravity, Urine: 1.008 (ref 1.005–1.030)
Urobilinogen, UA: 0.2 mg/dL (ref 0.0–1.0)
pH: 5 (ref 5.0–8.0)

## 2015-01-03 LAB — LIPASE, BLOOD: LIPASE: 41 U/L (ref 22–51)

## 2015-01-03 LAB — CBC WITH DIFFERENTIAL/PLATELET
BASOS ABS: 0 10*3/uL (ref 0.0–0.1)
Basophils Relative: 0 % (ref 0–1)
EOS PCT: 3 % (ref 0–5)
Eosinophils Absolute: 0.2 10*3/uL (ref 0.0–0.7)
HCT: 43.2 % (ref 36.0–46.0)
Hemoglobin: 14.1 g/dL (ref 12.0–15.0)
Lymphocytes Relative: 52 % — ABNORMAL HIGH (ref 12–46)
Lymphs Abs: 2.7 10*3/uL (ref 0.7–4.0)
MCH: 29.5 pg (ref 26.0–34.0)
MCHC: 32.6 g/dL (ref 30.0–36.0)
MCV: 90.4 fL (ref 78.0–100.0)
Monocytes Absolute: 0.4 10*3/uL (ref 0.1–1.0)
Monocytes Relative: 8 % (ref 3–12)
Neutro Abs: 1.9 10*3/uL (ref 1.7–7.7)
Neutrophils Relative %: 37 % — ABNORMAL LOW (ref 43–77)
Platelets: 219 10*3/uL (ref 150–400)
RBC: 4.78 MIL/uL (ref 3.87–5.11)
RDW: 14.3 % (ref 11.5–15.5)
WBC: 5.2 10*3/uL (ref 4.0–10.5)

## 2015-01-03 LAB — I-STAT TROPONIN, ED: Troponin i, poc: 0.01 ng/mL (ref 0.00–0.08)

## 2015-01-03 MED ORDER — IBUPROFEN 800 MG PO TABS: 800.0000 mg | ORAL_TABLET | Freq: Three times a day (TID) | ORAL | Status: DC

## 2015-01-03 MED ORDER — ALBUTEROL SULFATE (2.5 MG/3ML) 0.083% IN NEBU
5.0000 mg | INHALATION_SOLUTION | Freq: Once | RESPIRATORY_TRACT | Status: AC
  Administered 2015-01-03: 5 mg via RESPIRATORY_TRACT
  Filled 2015-01-03: qty 6

## 2015-01-03 MED ORDER — IPRATROPIUM BROMIDE 0.02 % IN SOLN
0.5000 mg | Freq: Once | RESPIRATORY_TRACT | Status: AC
  Administered 2015-01-03: 0.5 mg via RESPIRATORY_TRACT
  Filled 2015-01-03: qty 2.5

## 2015-01-03 MED ORDER — KETOROLAC TROMETHAMINE 30 MG/ML IJ SOLN
30.0000 mg | Freq: Once | INTRAMUSCULAR | Status: AC
  Administered 2015-01-03: 30 mg via INTRAVENOUS
  Filled 2015-01-03: qty 1

## 2015-01-03 MED ORDER — SODIUM CHLORIDE 0.9 % IV BOLUS (SEPSIS)
1000.0000 mL | Freq: Once | INTRAVENOUS | Status: AC
Start: 2015-01-03 — End: 2015-01-03
  Administered 2015-01-03: 1000 mL via INTRAVENOUS

## 2015-01-03 MED ORDER — SODIUM CHLORIDE 0.9 % IV BOLUS (SEPSIS)
1000.0000 mL | Freq: Once | INTRAVENOUS | Status: AC
  Administered 2015-01-03: 1000 mL via INTRAVENOUS

## 2015-01-03 NOTE — ED Notes (Signed)
Per EMS, Patient has had chest pain for the past week and states it worsens with palpations, talking, walking, breathing. Patient points to the epigastric pain. Patient is able to place ice pack on the position and it would ease the pain enough for her to fall asleep. Patient reports generalized weakness, nausea with no vomiting, and SOB. Patient has HX of Asthma. Tried to use inhaler today with no relief. Vitals per EMS: 124/64, 76 HR, 20 RR, 98%-100%, 100 CBG

## 2015-01-03 NOTE — Discharge Instructions (Signed)

## 2015-01-03 NOTE — ED Notes (Signed)
MD Yao at the bedside.  

## 2015-01-03 NOTE — ED Notes (Signed)
Patient transported to X-ray 

## 2015-01-03 NOTE — ED Notes (Signed)
Patient returned from X-ray 

## 2015-01-03 NOTE — ED Provider Notes (Signed)
CSN: [id number]     Arrival date & time 01/03/15  1746 History   First MD Initiated Contact with Patient 01/03/15 1803     Chief Complaint  Patient presents with  . Abdominal Pain     (Consider location/radiation/quality/duration/timing/severity/associated sxs/prior Treatment) HPI Comments: Patient with past medical history of asthma presents to the emergency department with chief complaint of chest pain. She states that the pain is in her chest and epigastric region. She reports having associated cough. She states the pain is worsened with palpation, talking, walking, and breathing. 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 She reports some associated nausea, but denies any vomiting. She denies any abdominal pain. She has tried using her inhaler with no relief. She denies any fevers or chills. She states that she frequently eats spicy food, but denies association to symptoms.  The history is provided by the patient. No language interpreter was used.    Past Medical History  Diagnosis Date  . Infertility associated with anovulation   . Asthma    Past Surgical History  Procedure Laterality Date  . Pelvic laparoscopy     No family history on file. History  Substance Use Topics  . Smoking status: Never Smoker   . Smokeless tobacco: Never Used  . Alcohol Use: No   OB History    Gravida Para Term Preterm AB TAB SAB Ectopic Multiple Living   0              Review of Systems  Constitutional: Negative for fever and chills.  Respiratory: Negative for shortness of breath.   Cardiovascular: Positive for chest pain.  Gastrointestinal: Positive for abdominal pain. Negative for nausea, vomiting, diarrhea and constipation.  Genitourinary: Negative for dysuria.  All other systems reviewed and are negative.     Allergies  Review of patient's allergies indicates no known  allergies.  Home Medications   Prior to Admission medications   Medication Sig Start Date End Date Taking? Authorizing Provider  cabergoline (DOSTINEX) 0.5 MG tablet Take 0.5 mg by mouth once a week. Every Sunday for 2 months - last dose 10/14/13    Historical Provider, MD  fluticasone (FLONASE) 50 MCG/ACT nasal spray Place 2 sprays into both nostrils daily. 10/14/13   Hannah Muthersbaugh, PA-C  ibuprofen (ADVIL,MOTRIN) 800 MG tablet Take 1 tablet (800 mg total) by mouth 3 (three) times daily. 10/14/13   Tatyana Kirichenko, PA-C  naphazoline-pheniramine (NAPHCON-A) 0.025-0.3 % ophthalmic solution Place 1 drop into both eyes every 6 (six) hours as needed for irritation. 10/14/13   Hannah Muthersbaugh, PA-C  Naproxen Sodium (FLANAX PAIN RELIEF PO) Take 2 tablets by mouth once.    Historical Provider, MD   BP 103/56 mmHg  Pulse 74  Temp(Src) 98.7 F (37.1 C) (Oral)  Resp 21  SpO2 100%  LMP 01/03/2015 Physical Exam  Constitutional: She is oriented to person, place, and time. She appears well-developed and well-nourished.  HENT:  Head: Normocephalic and atraumatic.  Eyes: Conjunctivae and EOM are normal. Pupils are equal, round, and reactive to light.  Neck: Normal range of motion. Neck supple.  Cardiovascular: Normal rate and regular rhythm.  Exam reveals no gallop and no friction rub.   No murmur heard. Pulmonary/Chest: Effort normal and breath sounds normal. No respiratory distress. She has no wheezes. She has no rales. She exhibits no tenderness.  Anterior chest wall is tender to  palpation Clear to auscultation bilaterally  Abdominal: Soft. Bowel sounds are normal. She exhibits no distension and no mass. There is no tenderness. There is no rebound and no guarding.  Musculoskeletal: Normal range of motion. She exhibits no edema or tenderness.  Neurological: She is alert and oriented to person, place, and time.  Skin: Skin is warm and dry.  Psychiatric: She has a normal mood and affect.  Her behavior is normal. Judgment and thought content normal.  Nursing note and vitals reviewed.   ED Course  Procedures (including critical care time) Results for orders placed or performed during the hospital encounter of 01/03/15  CBC with Differential  Result Value Ref Range   WBC 5.2 4.0 - 10.5 K/uL   RBC 4.78 3.87 - 5.11 MIL/uL   Hemoglobin 14.1 12.0 - 15.0 g/dL   HCT 16.1 09.6 - 04.5 %   MCV 90.4 78.0 - 100.0 fL   MCH 29.5 26.0 - 34.0 pg   MCHC 32.6 30.0 - 36.0 g/dL   RDW 40.9 81.1 - 91.4 %   Platelets 219 150 - 400 K/uL   Neutrophils Relative % 37 (L) 43 - 77 %   Neutro Abs 1.9 1.7 - 7.7 K/uL   Lymphocytes Relative 52 (H) 12 - 46 %   Lymphs Abs 2.7 0.7 - 4.0 K/uL   Monocytes Relative 8 3 - 12 %   Monocytes Absolute 0.4 0.1 - 1.0 K/uL   Eosinophils Relative 3 0 - 5 %   Eosinophils Absolute 0.2 0.0 - 0.7 K/uL   Basophils Relative 0 0 - 1 %   Basophils Absolute 0.0 0.0 - 0.1 K/uL  Comprehensive metabolic panel  Result Value Ref Range   Sodium 139 135 - 145 mmol/L   Potassium 4.6 3.5 - 5.1 mmol/L   Chloride 110 101 - 111 mmol/L   CO2 19 (L) 22 - 32 mmol/L   Glucose, Bld 79 65 - 99 mg/dL   BUN 11 6 - 20 mg/dL   Creatinine, Ser 7.82 0.44 - 1.00 mg/dL   Calcium 8.8 (L) 8.9 - 10.3 mg/dL   Total Protein 6.5 6.5 - 8.1 g/dL   Albumin 3.5 3.5 - 5.0 g/dL   AST 29 15 - 41 U/L   ALT 27 14 - 54 U/L   Alkaline Phosphatase 75 38 - 126 U/L   Total Bilirubin 0.6 0.3 - 1.2 mg/dL   GFR calc non Af Amer >60 >60 mL/min   GFR calc Af Amer >60 >60 mL/min   Anion gap 10 5 - 15  Lipase, blood  Result Value Ref Range   Lipase 41 22 - 51 U/L  Urinalysis, Routine w reflex microscopic (not at [facility])  Result Value Ref Range   Color, Urine YELLOW YELLOW   APPearance CLEAR CLEAR   Specific Gravity, Urine 1.008 1.005 - 1.030   pH 5.0 5.0 - 8.0   Glucose, UA NEGATIVE NEGATIVE mg/dL   Hgb urine dipstick NEGATIVE NEGATIVE   Bilirubin Urine NEGATIVE NEGATIVE   Ketones, ur NEGATIVE NEGATIVE  mg/dL   Protein, ur NEGATIVE NEGATIVE mg/dL   Urobilinogen, UA 0.2 0.0 - 1.0 mg/dL   Nitrite NEGATIVE NEGATIVE   Leukocytes, UA NEGATIVE NEGATIVE  I-stat troponin, ED  (not at [facility])  Result Value Ref Range   Troponin i, poc 0.01 0.00 - 0.08 ng/mL   Comment 3           Dg Chest 2 View  01/03/2015   CLINICAL DATA:  Central chest  pain and shortness of Breath  EXAM: CHEST - 2 VIEW  COMPARISON:  01/31/2013  FINDINGS: The heart size and mediastinal contours are within normal limits. Both lungs are clear. The visualized skeletal structures are unremarkable.  IMPRESSION: No active disease.   Electronically Signed   By: Alcide Clever M.D.   On: 01/03/2015 18:53      EKG Interpretation   Date/Time:  Friday January 03 2015 17:56:19 EDT Ventricular Rate:  66 PR Interval:  134 QRS Duration: 78 QT Interval:  412 QTC Calculation: 432 R Axis:   42 Text Interpretation:  Sinus rhythm No significant change since last  tracing Confirmed by YAO  MD, DAVID ([id number]) on 01/03/2015 6:33:04 PM      MDM   Final diagnoses:  Chest pain  Chest wall pain    Patient with chest tenderness and epigastric tenderness to palpation. Patient has been having pain for 1 week. She does have a history of asthma, but no wheezing on exam. I will try one breathing treatment. 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 The pain is reproducible with palpation. Will check basic labs, and will reassess.  9:20 PM Patient reassessed.  Feels ok.  Suspect that symptoms are musculoskeletal.  Labs and workup are reassuring.  Anterior chest wall TTP.    10:52 PM Patient seen by and discussed with Dr. Silverio Lay, who agrees with plan discharge. Pain is reproducible. Doubt ACS, doubt PE. Discharge to home with primary care follow-up. Patient is stable and ready for discharge.   Roxy Horseman, PA-C 01/03/15 2252  Richardean Canal, MD 01/04/15 [phone]

## 2015-01-03 NOTE — ED Notes (Signed)
PA at the bedside.

## 2015-01-03 NOTE — ED Notes (Signed)
PA Rob at the bedside 

## 2015-06-26 ENCOUNTER — Emergency Department (HOSPITAL_COMMUNITY): Payer: 59

## 2015-06-26 ENCOUNTER — Encounter (HOSPITAL_COMMUNITY): Payer: Self-pay

## 2015-06-26 ENCOUNTER — Emergency Department (HOSPITAL_COMMUNITY)
Admission: EM | Admit: 2015-06-26 | Discharge: 2015-06-26 | Disposition: A | Discharge status: Home / Self Care | Payer: 59 | Attending: Emergency Medicine | Admitting: Emergency Medicine

## 2015-06-26 DIAGNOSIS — Z8742 Personal history of other diseases of the female genital tract: Secondary | ICD-10-CM | POA: Diagnosis not present

## 2015-06-26 DIAGNOSIS — Y9289 Other specified places as the place of occurrence of the external cause: Secondary | ICD-10-CM | POA: Diagnosis not present

## 2015-06-26 DIAGNOSIS — S93401A Sprain of unspecified ligament of right ankle, initial encounter: Secondary | ICD-10-CM | POA: Insufficient documentation

## 2015-06-26 DIAGNOSIS — Y998 Other external cause status: Secondary | ICD-10-CM | POA: Insufficient documentation

## 2015-06-26 DIAGNOSIS — S3992XA Unspecified injury of lower back, initial encounter: Secondary | ICD-10-CM | POA: Diagnosis not present

## 2015-06-26 DIAGNOSIS — W1839XA Other fall on same level, initial encounter: Secondary | ICD-10-CM | POA: Insufficient documentation

## 2015-06-26 DIAGNOSIS — Y9301 Activity, walking, marching and hiking: Secondary | ICD-10-CM | POA: Insufficient documentation

## 2015-06-26 DIAGNOSIS — J45909 Unspecified asthma, uncomplicated: Secondary | ICD-10-CM | POA: Insufficient documentation

## 2015-06-26 DIAGNOSIS — S99911A Unspecified injury of right ankle, initial encounter: Secondary | ICD-10-CM | POA: Diagnosis present

## 2015-06-26 DIAGNOSIS — Z791 Long term (current) use of non-steroidal anti-inflammatories (NSAID): Secondary | ICD-10-CM | POA: Insufficient documentation

## 2015-06-26 MED ORDER — IBUPROFEN 800 MG PO TABS: 800.0000 mg | ORAL_TABLET | Freq: Three times a day (TID) | ORAL | Status: DC | PRN

## 2015-06-26 MED ORDER — IBUPROFEN 200 MG PO TABS
600.0000 mg | ORAL_TABLET | Freq: Once | ORAL | Status: AC
  Administered 2015-06-26: 600 mg via ORAL
  Filled 2015-06-26: qty 3

## 2015-06-26 NOTE — Discharge Instructions (Signed)
Acute Ankle Sprain With Phase I Rehab An acute ankle sprain is a partial or complete tear in one or more of the ligaments of the ankle due to traumatic injury. The severity of the injury depends on both the number of ligaments sprained and the grade of sprain. There are 3 grades of sprains.   A grade 1 sprain is a mild sprain. There is a slight pull without obvious tearing. There is no loss of strength, and the muscle and ligament are the correct length.  A grade 2 sprain is a moderate sprain. There is tearing of fibers within the substance of the ligament where it connects two bones or two cartilages. The length of the ligament is increased, and there is usually decreased strength.  A grade 3 sprain is a complete rupture of the ligament and is uncommon. In addition to the grade of sprain, there are three types of ankle sprains.  Lateral ankle sprains: This is a sprain of one or more of the three ligaments on the outer side (lateral) of the ankle. These are the most common sprains. Medial ankle sprains: There is one large triangular ligament of the inner side (medial) of the ankle that is susceptible to injury. Medial ankle sprains are less common. Syndesmosis, "high ankle," sprains: The syndesmosis is the ligament that connects the two bones of the lower leg. Syndesmosis sprains usually only occur with very severe ankle sprains. SYMPTOMS  Pain, tenderness, and swelling in the ankle, starting at the side of injury that may progress to the whole ankle and foot with time.  "Pop" or tearing sensation at the time of injury.  Bruising that may spread to the heel.  Impaired ability to walk soon after injury. CAUSES   Acute ankle sprains are caused by trauma placed on the ankle that temporarily forces or pries the anklebone (talus) out of its normal socket.  Stretching or tearing of the ligaments that normally hold the joint in place (usually due to a twisting injury). RISK INCREASES  WITH:  Previous ankle sprain.  Sports in which the foot may land awkwardly (i.e., basketball, volleyball, or soccer) or walking or running on uneven or rough surfaces.  Shoes with inadequate support to prevent sideways motion when stress occurs.  Poor strength and flexibility.  Poor balance skills.  Contact sports. PREVENTION   Warm up and stretch properly before activity.  Maintain physical fitness:  Ankle and leg flexibility, muscle strength, and endurance.  Cardiovascular fitness.  Balance training activities.  Use proper technique and have a coach correct improper technique.  Taping, protective strapping, bracing, or high-top tennis shoes may help prevent injury. Initially, tape is best; however, it loses most of its support function within 10 to 15 minutes.  Wear proper-fitted protective shoes (High-top shoes with taping or bracing is more effective than either alone).  Provide the ankle with support during sports and practice activities for 12 months following injury. PROGNOSIS   If treated properly, ankle sprains can be expected to recover completely; however, the length of recovery depends on the degree of injury.  A grade 1 sprain usually heals enough in 5 to 7 days to allow modified activity and requires an average of 6 weeks to heal completely.  A grade 2 sprain requires 6 to 10 weeks to heal completely.  A grade 3 sprain requires 12 to 16 weeks to heal.  A syndesmosis sprain often takes more than 3 months to heal. RELATED COMPLICATIONS   Frequent recurrence of symptoms may   result in a chronic problem. Appropriately addressing the problem the first time decreases the frequency of recurrence and optimizes healing time. Severity of the initial sprain does not predict the likelihood of later instability. °· Injury to other structures (bone, cartilage, or tendon). °· A chronically unstable or arthritic ankle joint is a possibility with repeated  sprains. °TREATMENT °Treatment initially involves the use of ice, medication, and compression bandages to help reduce pain and inflammation. Ankle sprains are usually immobilized in a walking cast or boot to allow for healing. Crutches may be recommended to reduce pressure on the injury. After immobilization, strengthening and stretching exercises may be necessary to regain strength and a full range of motion. Surgery is rarely needed to treat ankle sprains. °MEDICATION  °· Nonsteroidal anti-inflammatory medications, such as aspirin and ibuprofen (do not take for the first 3 days after injury or within 7 days before surgery), or other minor pain relievers, such as acetaminophen, are often recommended. Take these as directed by your caregiver. Contact your caregiver immediately if any bleeding, stomach upset, or signs of an allergic reaction occur from these medications. °· Ointments applied to the skin may be helpful. °· Pain relievers may be prescribed as necessary by your caregiver. Do not take prescription pain medication for longer than 4 to 7 days. Use only as directed and only as much as you need. °HEAT AND COLD °· Cold treatment (icing) is used to relieve pain and reduce inflammation for acute and chronic cases. Cold should be applied for 10 to 15 minutes every 2 to 3 hours for inflammation and pain and immediately after any activity that aggravates your symptoms. Use ice packs or an ice massage. °· Heat treatment may be used before performing stretching and strengthening activities prescribed by your caregiver. Use a heat pack or a warm soak. °SEEK IMMEDIATE MEDICAL CARE IF:  °· Pain, swelling, or bruising worsens despite treatment. °· You experience pain, numbness, discoloration, or coldness in the foot or toes. °· New, unexplained symptoms develop (drugs used in treatment may produce side effects.) °EXERCISES  °PHASE I EXERCISES °RANGE OF MOTION (ROM) AND STRETCHING EXERCISES - Ankle Sprain, Acute Phase I,  Weeks 1 to 2 °These exercises may help you when beginning to restore flexibility in your ankle. You will likely work on these exercises for the 1 to 2 weeks after your injury. Once your physician, physical therapist, or athletic trainer sees adequate progress, he or she will advance your exercises. While completing these exercises, remember:  °· Restoring tissue flexibility helps normal motion to return to the joints. This allows healthier, less painful movement and activity. °· An effective stretch should be held for at least 30 seconds. °· A stretch should never be painful. You should only feel a gentle lengthening or release in the stretched tissue. °RANGE OF MOTION - Dorsi/Plantar Flexion °· While sitting with your right / left knee straight, draw the top of your foot upwards by flexing your ankle. Then reverse the motion, pointing your toes downward. °· Hold each position for __________ seconds. °· After completing your first set of exercises, repeat this exercise with your knee bent. °Repeat __________ times. Complete this exercise __________ times per day.  °RANGE OF MOTION - Ankle Alphabet °· Imagine your right / left big toe is a pen. °· Keeping your hip and knee still, write out the entire alphabet with your "pen." Make the letters as large as you can without increasing any discomfort. °Repeat __________ times. Complete this exercise __________   times per day.  °STRENGTHENING EXERCISES - Ankle Sprain, Acute -Phase I, Weeks 1 to 2 °These exercises may help you when beginning to restore strength in your ankle. You will likely work on these exercises for 1 to 2 weeks after your injury. Once your physician, physical therapist, or athletic trainer sees adequate progress, he or she will advance your exercises. While completing these exercises, remember:  °· Muscles can gain both the endurance and the strength needed for everyday activities through controlled exercises. °· Complete these exercises as instructed by  your physician, physical therapist, or athletic trainer. Progress the resistance and repetitions only as guided. °· You may experience muscle soreness or fatigue, but the pain or discomfort you are trying to eliminate should never worsen during these exercises. If this pain does worsen, stop and make certain you are following the directions exactly. If the pain is still present after adjustments, discontinue the exercise until you can discuss the trouble with your clinician. °STRENGTH - Dorsiflexors °· Secure a rubber exercise band/tubing to a fixed object (i.e., table, pole) and loop the other end around your right / left foot. °· Sit on the floor facing the fixed object. The band/tubing should be slightly tense when your foot is relaxed. °· Slowly draw your foot back toward you using your ankle and toes. °· Hold this position for __________ seconds. Slowly release the tension in the band and return your foot to the starting position. °Repeat __________ times. Complete this exercise __________ times per day.  °STRENGTH - Plantar-flexors  °· Sit with your right / left leg extended. Holding onto both ends of a rubber exercise band/tubing, loop it around the ball of your foot. Keep a slight tension in the band. °· Slowly push your toes away from you, pointing them downward. °· Hold this position for __________ seconds. Return slowly, controlling the tension in the band/tubing. °Repeat __________ times. Complete this exercise __________ times per day.  °STRENGTH - Ankle Eversion °· Secure one end of a rubber exercise band/tubing to a fixed object (table, pole). Loop the other end around your foot just before your toes. °· Place your fists between your knees. This will focus your strengthening at your ankle. °· Drawing the band/tubing across your opposite foot, slowly, pull your little toe out and up. Make sure the band/tubing is positioned to resist the entire motion. °· Hold this position for __________ seconds. °Have  your muscles resist the band/tubing as it slowly pulls your foot back to the starting position.  °Repeat __________ times. Complete this exercise __________ times per day.  °STRENGTH - Ankle Inversion °· Secure one end of a rubber exercise band/tubing to a fixed object (table, pole). Loop the other end around your foot just before your toes. °· Place your fists between your knees. This will focus your strengthening at your ankle. °· Slowly, pull your big toe up and in, making sure the band/tubing is positioned to resist the entire motion. °· Hold this position for __________ seconds. °· Have your muscles resist the band/tubing as it slowly pulls your foot back to the starting position. °Repeat __________ times. Complete this exercises __________ times per day.  °STRENGTH - Towel Curls °· Sit in a chair positioned on a non-carpeted surface. °· Place your right / left foot on a towel, keeping your heel on the floor. °· Pull the towel toward your heel by only curling your toes. Keep your heel on the floor. °· If instructed by your physician, physical therapist,   or athletic trainer, add weight to the end of the towel. Repeat _____5_____ times. Complete this exercise ________2__ times per day.   This information is not intended to replace advice given to you by your health care provider. Make sure you discuss any questions you have with your health care provider.   Document Released: 02/10/2005 Document Revised: 08/02/2014 Document Reviewed: 10/24/2008 Else[employer] Interactive Patient Education [employer]

## 2015-06-26 NOTE — ED Provider Notes (Signed)
Xray reviewed no fracture will apply ASO splint and give rehab exercises   Earley FavorGail Wilkie Zenon, NP 06/26/15 2156  Lorre NickAnthony Allen, MD 06/26/15 [phone]

## 2015-06-26 NOTE — ED Notes (Signed)
PT C/O RIGHT ANKLE PAIN AND SWELLING AFTER SHE TWISTED IT WALKING IN THE FRONT DOOR. PT STATES SHE FELL ONTO HER RIGHT SIDE, CAUSING PAIN TO THE RIGHT LOWER BACK AS WELL. DENIES HEAD INJURY OR LOC.

## 2015-06-26 NOTE — ED Provider Notes (Signed)
CSN: [id number]     Arrival date & time 06/26/15  2052 History  By signing my name below, I, Soijett Blue, attest that this documentation has been prepared under the direction and in the presence of Fayrene HelperBowie Jarquis Walker, PA-C Electronically Signed: Soijett Blue, ED Scribe. 06/26/2015. 9:23 PM.   Chief Complaint  Patient presents with  . Ankle Pain    RIGHT SINCE LAST NIGHT FROM A TWIST      The history is provided by the patient. No language interpreter was used.    [name] is a 44 y.o. female who presents to the Emergency Department complaining of moderate right ankle pain/swelling onset last night. She notes that she was ambulating and her right ankle twisted and she fell onto her right side. She denies any injury/trauma to this ankle in the past. Pt is having associated symptoms of right low back pain, and gait problem due to pain. She notes that she has tried ace wrap with no relief of her symptoms. She denies hitting her head, color change, wound, rash, right knee pain, and any other symptoms.    Past Medical History  Diagnosis Date  . Infertility associated with anovulation   . Asthma    Past Surgical History  Procedure Laterality Date  . Pelvic laparoscopy     History reviewed. No pertinent family history. Social History  Substance Use Topics  . Smoking status: Never Smoker   . Smokeless tobacco: Never Used  . Alcohol Use: No   OB History    Gravida Para Term Preterm AB TAB SAB Ectopic Multiple Living   0              Review of Systems  Musculoskeletal: Positive for back pain, joint swelling, arthralgias and gait problem.  Skin: Negative for color change, rash and wound.    Allergies  Review of patient's allergies indicates no known allergies.  Home Medications   Prior to Admission medications   Medication Sig Start Date End Date Taking? Authorizing Provider  fluticasone (FLONASE) 50 MCG/ACT nasal spray Place 2 sprays into both nostrils daily. Patient taking  differently: Place 2 sprays into both nostrils daily as needed.  10/14/13   Hannah Muthersbaugh, PA-C  ibuprofen (ADVIL,MOTRIN) 800 MG tablet Take 1 tablet (800 mg total) by mouth 3 (three) times daily. 01/03/15   Roxy Horsemanobert Browning, PA-C  naphazoline-pheniramine (NAPHCON-A) 0.025-0.3 % ophthalmic solution Place 1 drop into both eyes every 6 (six) hours as needed for irritation. Patient not taking: Reported on 01/03/2015 10/14/13   Dahlia ClientHannah Muthersbaugh, PA-C   BP 110/92 mmHg  Pulse 88  Temp(Src) 98.1 F (36.7 C) (Oral)  Resp 20  Ht 5\' 6"  (1.676 m)  Wt 245 lb (111.131 kg)  BMI 39.56 kg/m2  SpO2 100%  LMP 06/19/2015 Physical Exam  Constitutional: She is oriented to person, place, and time. She appears well-developed and well-nourished. No distress.  HENT:  Head: Normocephalic and atraumatic.  Mouth/Throat: Oropharynx is clear and moist.  Eyes: EOM are normal.  Neck: Neck supple.  Cardiovascular: Normal rate.   Pulmonary/Chest: Effort normal.  Musculoskeletal: Normal range of motion.       Right ankle: Tenderness. Lateral malleolus tenderness found.  Right ankle: tenderness noted to the lateral malleolus region with moderate edema noted. No pain to mid foot or fifth metatarsal.   Neurological: She is alert and oriented to person, place, and time.  Skin: Skin is warm and dry. She is not diaphoretic.  Psychiatric: She has a normal mood and  affect. Her behavior is normal.  Nursing note and vitals reviewed.   ED Course  Procedures (including critical care time) DIAGNOSTIC STUDIES: Oxygen Saturation is 100% on RA, nl by my interpretation.    COORDINATION OF CARE: 9:12 PM-I suspect that this is a probable right ankle sprain. Low suspicion for fracture at this time, however I will obtain a right ankle xray to rule out fracture. Will treat the pt with ibuprofen and ASO brace. Discussed treatment plan with pt at bedside and pt agreed to plan.  Care discussed with oncoming provider  Labs  Review Labs Reviewed - No data to display  Imaging Review No results found. I have personally reviewed and evaluated these images as part of my medical decision-making.   EKG Interpretation None      MDM   Final diagnoses:  None    BP 110/92 mmHg  Pulse 88  Temp(Src) 98.1 F (36.7 C) (Oral)  Resp 20  Ht  (1.676 m)  Wt 111.131 kg  BMI 39.56 kg/m2  SpO2 100%  LMP 06/19/2015   I personally performed the services described in this documentation, which was scribed in my presence. The recorded information has been reviewed and is accurate.      Fayrene Helper, PA-C 06/26/15 2131  Lorre Nick, MD 06/26/15 [phone]

## 2018-10-04 ENCOUNTER — Observation Stay (HOSPITAL_COMMUNITY)
Admission: EM | Admit: 2018-10-04 | Discharge: 2018-10-06 | Disposition: A | Discharge status: Home / Self Care | Payer: BLUE CROSS/BLUE SHIELD | Attending: Internal Medicine | Admitting: Internal Medicine

## 2018-10-04 ENCOUNTER — Other Ambulatory Visit: Payer: Self-pay

## 2018-10-04 ENCOUNTER — Emergency Department (HOSPITAL_COMMUNITY): Payer: BLUE CROSS/BLUE SHIELD

## 2018-10-04 DIAGNOSIS — J101 Influenza due to other identified influenza virus with other respiratory manifestations: Secondary | ICD-10-CM | POA: Diagnosis present

## 2018-10-04 DIAGNOSIS — J4541 Moderate persistent asthma with (acute) exacerbation: Secondary | ICD-10-CM | POA: Diagnosis present

## 2018-10-04 DIAGNOSIS — R000 Tachycardia, unspecified: Secondary | ICD-10-CM | POA: Insufficient documentation

## 2018-10-04 DIAGNOSIS — E876 Hypokalemia: Secondary | ICD-10-CM

## 2018-10-04 DIAGNOSIS — Z79899 Other long term (current) drug therapy: Secondary | ICD-10-CM | POA: Diagnosis not present

## 2018-10-04 DIAGNOSIS — Z791 Long term (current) use of non-steroidal anti-inflammatories (NSAID): Secondary | ICD-10-CM | POA: Insufficient documentation

## 2018-10-04 DIAGNOSIS — I959 Hypotension, unspecified: Secondary | ICD-10-CM | POA: Insufficient documentation

## 2018-10-04 DIAGNOSIS — J45901 Unspecified asthma with (acute) exacerbation: Secondary | ICD-10-CM | POA: Diagnosis not present

## 2018-10-04 LAB — CBC WITH DIFFERENTIAL/PLATELET
Abs Immature Granulocytes: 0.01 10*3/uL (ref 0.00–0.07)
BASOS PCT: 0 %
Basophils Absolute: 0 10*3/uL (ref 0.0–0.1)
EOS PCT: 0 %
Eosinophils Absolute: 0 10*3/uL (ref 0.0–0.5)
HEMATOCRIT: 45.1 % (ref 36.0–46.0)
Hemoglobin: 14.1 g/dL (ref 12.0–15.0)
Immature Granulocytes: 0 %
Lymphocytes Relative: 45 %
Lymphs Abs: 3.3 10*3/uL (ref 0.7–4.0)
MCH: 29.3 pg (ref 26.0–34.0)
MCHC: 31.3 g/dL (ref 30.0–36.0)
MCV: 93.8 fL (ref 80.0–100.0)
Monocytes Absolute: 0.6 10*3/uL (ref 0.1–1.0)
Monocytes Relative: 8 %
Neutro Abs: 3.4 10*3/uL (ref 1.7–7.7)
Neutrophils Relative %: 47 %
Platelets: 169 10*3/uL (ref 150–400)
RBC: 4.81 MIL/uL (ref 3.87–5.11)
RDW: 14.6 % (ref 11.5–15.5)
WBC: 7.3 10*3/uL (ref 4.0–10.5)
nRBC: 0 % (ref 0.0–0.2)

## 2018-10-04 LAB — BASIC METABOLIC PANEL
ANION GAP: 9 (ref 5–15)
BUN: 16 mg/dL (ref 6–20)
CO2: 23 mmol/L (ref 22–32)
Calcium: 8.8 mg/dL — ABNORMAL LOW (ref 8.9–10.3)
Chloride: 108 mmol/L (ref 98–111)
Creatinine, Ser: 1.18 mg/dL — ABNORMAL HIGH (ref 0.44–1.00)
GFR calc Af Amer: >60 mL/min (ref >60)
GFR, EST NON AFRICAN AMERICAN: 56 mL/min — AB (ref >60)
GLUCOSE: 115 mg/dL — AB (ref 70–99)
POTASSIUM: 3.4 mmol/L — AB (ref 3.5–5.1)
SODIUM: 140 mmol/L (ref 135–145)

## 2018-10-04 LAB — INFLUENZA PANEL BY PCR (TYPE A & B)
INFLAPCR: POSITIVE — AB
Influenza B By PCR: NEGATIVE

## 2018-10-04 LAB — MAGNESIUM: Magnesium: 2.7 mg/dL — ABNORMAL HIGH (ref 1.7–2.4)

## 2018-10-04 MED ORDER — ALBUTEROL SULFATE (2.5 MG/3ML) 0.083% IN NEBU: 2.5000 mg | INHALATION_SOLUTION | RESPIRATORY_TRACT | Status: DC | PRN

## 2018-10-04 MED ORDER — IPRATROPIUM-ALBUTEROL 0.5-2.5 (3) MG/3ML IN SOLN: 3.0000 mL | Freq: Three times a day (TID) | RESPIRATORY_TRACT | Status: DC

## 2018-10-04 MED ORDER — SODIUM CHLORIDE 0.9 % IV SOLN
INTRAVENOUS | Status: AC
  Administered 2018-10-04 – 2018-10-05 (×2): via INTRAVENOUS

## 2018-10-04 MED ORDER — ALBUTEROL (5 MG/ML) CONTINUOUS INHALATION SOLN
10.0000 mg/h | INHALATION_SOLUTION | RESPIRATORY_TRACT | Status: DC
  Administered 2018-10-04: 10 mg/h via RESPIRATORY_TRACT
  Filled 2018-10-04: qty 20

## 2018-10-04 MED ORDER — SODIUM CHLORIDE 0.9 % IV BOLUS
1000.0000 mL | Freq: Once | INTRAVENOUS | Status: AC
  Administered 2018-10-04: 1000 mL via INTRAVENOUS

## 2018-10-04 MED ORDER — METHYLPREDNISOLONE SODIUM SUCC 125 MG IJ SOLR
125.0000 mg | Freq: Once | INTRAMUSCULAR | Status: AC
  Administered 2018-10-04: 125 mg via INTRAVENOUS
  Filled 2018-10-04: qty 2

## 2018-10-04 MED ORDER — OSELTAMIVIR PHOSPHATE 75 MG PO CAPS
75.0000 mg | ORAL_CAPSULE | Freq: Two times a day (BID) | ORAL | Status: DC
Start: 2018-10-05 — End: 2018-10-06
  Administered 2018-10-05 – 2018-10-06 (×3): 75 mg via ORAL
  Filled 2018-10-04 (×3): qty 1

## 2018-10-04 MED ORDER — IPRATROPIUM-ALBUTEROL 0.5-2.5 (3) MG/3ML IN SOLN
3.0000 mL | Freq: Three times a day (TID) | RESPIRATORY_TRACT | Status: DC
  Administered 2018-10-05: 3 mL via RESPIRATORY_TRACT
  Filled 2018-10-04: qty 3

## 2018-10-04 MED ORDER — OSELTAMIVIR PHOSPHATE 75 MG PO CAPS
75.0000 mg | ORAL_CAPSULE | Freq: Once | ORAL | Status: AC
  Administered 2018-10-04: 75 mg via ORAL
  Filled 2018-10-04: qty 1

## 2018-10-04 MED ORDER — POTASSIUM CHLORIDE CRYS ER 20 MEQ PO TBCR
40.0000 meq | EXTENDED_RELEASE_TABLET | Freq: Once | ORAL | Status: AC
  Administered 2018-10-04: 40 meq via ORAL
  Filled 2018-10-04: qty 2

## 2018-10-04 MED ORDER — PREDNISONE 20 MG PO TABS
40.0000 mg | ORAL_TABLET | Freq: Every day | ORAL | Status: DC
  Administered 2018-10-05: 40 mg via ORAL
  Filled 2018-10-04: qty 2

## 2018-10-04 MED ORDER — ACETAMINOPHEN 325 MG PO TABS
650.0000 mg | ORAL_TABLET | ORAL | Status: DC | PRN
  Administered 2018-10-04: 650 mg via ORAL
  Filled 2018-10-04: qty 2

## 2018-10-04 NOTE — ED Notes (Signed)
ED TO INPATIENT HANDOFF REPORT  Name/Age/Gender [name] 48 y.o. female Room/Bed: WA10/WA10  Code Status   Code Status: Not on file  Home/SNF/Other Home Patient oriented to: self, place, time and situation Is this baseline? Yes   Triage Complete: Triage complete  Chief Complaint Shortness of Breath  Triage Note C/C SOB x3 days, productive cough, wheezing all lobes, after DuoNeb patient is feeling much better per EMS.    Allergies No Known Allergies  Level of Care/Admitting Diagnosis ED Disposition    ED Disposition Condition Comment   Admit  Hospital Area: [facility] [[id number]]  Level of Care: Med-Surg [16]  Diagnosis: Asthma exacerbation [747340]  Admitting Physician: John Giovanni [[id number]]  Attending Physician: John Giovanni [[id number]]  PT Class (Do Not Modify): Observation [104]  PT Acc Code (Do Not Modify): Observation [10022]       B Medical/Surgery History Past Medical History:  Diagnosis Date  . Asthma   . Infertility associated with anovulation    Past Surgical History:  Procedure Laterality Date  . PELVIC LAPAROSCOPY       A IV Location/Drains/Wounds Patient Lines/Drains/Airways Status   Active Line/Drains/Airways    Name:   Placement date:   Placement time:   Site:   Days:   Peripheral IV 10/04/18 Right Antecubital   10/04/18    1945    Antecubital   less than 1          Intake/Output Last 24 hours  Intake/Output Summary (Last 24 hours) at 10/04/2018 2157 Last data filed at 10/04/2018 2122 Gross per 24 hour  Intake 1000 ml  Output -  Net 1000 ml    Labs/Imaging Results for orders placed or performed during the hospital encounter of 10/04/18 (from the past 48 hour(s))  Basic metabolic panel     Status: Abnormal   Collection Time: 10/04/18  7:45 PM  Result Value Ref Range   Sodium 140 135 - 145 mmol/L   Potassium 3.4 (L) 3.5 - 5.1 mmol/L   Chloride 108 98 - 111 mmol/L   CO2 23 22 - 32 mmol/L   Glucose, Bld 115 (H) 70 - 99 mg/dL   BUN 16 6 - 20 mg/dL   Creatinine, Ser 3.75 (H) 0.44 - 1.00 mg/dL   Calcium 8.8 (L) 8.9 - 10.3 mg/dL   GFR calc non Af Amer 56 (L) >60 mL/min   GFR calc Af Amer >60 >60 mL/min   Anion gap 9 5 - 15    Comment: Performed at [facility], [address] [place], [place] [address]  Influenza panel by PCR (type A & B)     Status: Abnormal   Collection Time: 10/04/18  7:45 PM  Result Value Ref Range   Influenza A By PCR POSITIVE (A) NEGATIVE   Influenza B By PCR NEGATIVE NEGATIVE    Comment: (NOTE) The Xpert Xpress Flu assay is intended as an aid in the diagnosis of  influenza and should not be used as a sole basis for treatment.  This  assay is FDA approved for nasopharyngeal swab specimens only. Nasal  washings and aspirates are unacceptable for Xpert Xpress Flu testing. Performed at [facility], [address], [place], [place] [address]   CBC with Differential     Status: None   Collection Time: 10/04/18  8:14 PM  Result Value Ref Range   WBC 7.3 4.0 - 10.5 K/uL   RBC 4.81 3.87 - 5.11 MIL/uL   Hemoglobin 14.1  12.0 - 15.0 g/dL   HCT 94.7 65.4 - 65.0 %   MCV 93.8 80.0 - 100.0 fL   MCH 29.3 26.0 - 34.0 pg   MCHC 31.3 30.0 - 36.0 g/dL   RDW 35.4 65.6 - 81.2 %   Platelets 169 150 - 400 K/uL   nRBC 0.0 0.0 - 0.2 %   Neutrophils Relative % 47 %   Neutro Abs 3.4 1.7 - 7.7 K/uL   Lymphocytes Relative 45 %   Lymphs Abs 3.3 0.7 - 4.0 K/uL   Monocytes Relative 8 %   Monocytes Absolute 0.6 0.1 - 1.0 K/uL   Eosinophils Relative 0 %   Eosinophils Absolute 0.0 0.0 - 0.5 K/uL   Basophils Relative 0 %   Basophils Absolute 0.0 0.0 - 0.1 K/uL   Immature Granulocytes 0 %   Abs Immature Granulocytes 0.01 0.00 - 0.07 K/uL    Comment: Performed at [facility], [address], [place], [place] [address]   Dg Chest Portable 1 View  Result Date: 10/04/2018 CLINICAL DATA:  Initial evaluation for acute  shortness of breath, wheezing, productive cough. EXAM: PORTABLE CHEST 1 VIEW COMPARISON:  Prior radiograph from 01/03/2015 FINDINGS: Cardiac and mediastinal silhouettes are stable in size and contour, and remain within normal limits. Lungs are hypoinflated. Associated bibasilar bronchovascular crowding/atelectasis. No consolidative opacity. No edema or effusion. No pneumothorax. No acute osseous finding. IMPRESSION: 1. Shallow lung inflation with associated bibasilar bronchovascular crowding/atelectasis. 2. No other active cardiopulmonary disease identified. Electronically Signed   By: Rise Mu M.D.   On: 10/04/2018 20:09    Pending Labs Unresulted Labs (From admission, onward)    Start     Ordered   10/05/18 0500  Basic metabolic panel  Tomorrow morning,   R     10/04/18 2137   10/04/18 2138  Magnesium  Add-on,   R     10/04/18 2137          Vitals/Pain Today's Vitals   10/04/18 1942 10/04/18 1948 10/04/18 2000 10/04/18 2151  BP: 131/67  124/69 (!) 124/58  Pulse: 94  93 (!) 103  Resp: (!) 23  18 17   Temp: 98.1 F (36.7 C)     TempSrc: Oral     SpO2: 98% 100% 99% 99%  Weight: 113.4 kg     Height: 5\' 6"  (1.676 m)     PainSc:  9       Isolation Precautions Droplet precaution  Medications Medications  predniSONE (DELTASONE) tablet 40 mg (has no administration in time range)  oseltamivir (TAMIFLU) capsule 75 mg (has no administration in time range)  0.9 %  sodium chloride infusion (has no administration in time range)  ipratropium-albuterol (DUONEB) 0.5-2.5 (3) MG/3ML nebulizer solution 3 mL (has no administration in time range)  albuterol (PROVENTIL) (2.5 MG/3ML) 0.083% nebulizer solution 2.5 mg (has no administration in time range)  potassium chloride SA (K-DUR,KLOR-CON) CR tablet 40 mEq (has no administration in time range)  sodium chloride 0.9 % bolus 1,000 mL (0 mLs Intravenous Stopped 10/04/18 2122)  methylPREDNISolone sodium succinate (SOLU-MEDROL) 125 mg/2 mL  injection 125 mg (125 mg Intravenous Given 10/04/18 1955)  oseltamivir (TAMIFLU) capsule 75 mg (75 mg Oral Given 10/04/18 2122)    Mobility walks Low fall risk

## 2018-10-04 NOTE — H&P (Signed)
History and Physical    [name] JSH:[id number] DOB: [birth year] DOA: 10/04/2018  PCP: System, Pcp Not In Patient coming from: Home  Chief Complaint: Shortness of breath, cough  HPI: [name] is a 48 y.o. female with medical history significant of asthma presenting to the hospital for evaluation of shortness of breath and cough.  Patient reports 3-day history of dyspnea, cough productive of yellow-colored sputum, wheezing, fatigue, dizziness, chills, decreased p.o. intake, and body aches.  States her asthma has previously been well controlled and she normally only uses albuterol occasionally as needed.  However, symptoms have been worse for the past 3 days.  She went to an urgent care center yesterday and was given a steroid injection and a breathing treatment but she continued to be symptomatic this morning.  Denies any recent travel or exposure to any individual with coronavirus infection.  Review of Systems: As per HPI otherwise 10 point review of systems negative.  Past Medical History:  Diagnosis Date   Asthma    Infertility associated with anovulation     Past Surgical History:  Procedure Laterality Date   PELVIC LAPAROSCOPY       reports that she has never smoked. She has never used smokeless tobacco. She reports that she does not drink alcohol or use drugs.  No Known Allergies  No family history on file.  Prior to Admission medications   Medication Sig Start Date End Date Taking? Authorizing Provider  ibuprofen (ADVIL,MOTRIN) 600 MG tablet Take 600 mg by mouth daily. 10/03/18  Yes [provider]  montelukast (SINGULAIR) 10 MG tablet Take 10 mg by mouth daily. 10/03/18  Yes [provider]  omeprazole (PRILOSEC) 20 MG capsule Take 20 mg by mouth daily. 08/09/17  Yes [provider]  PROAIR HFA 108 (90 Base) MCG/ACT inhaler Inhale 2 puffs into the lungs every 4 (four) hours as needed for wheezing or shortness of breath. 10/03/18  Yes  [provider]  fluticasone (FLONASE) 50 MCG/ACT nasal spray Place 2 sprays into both nostrils daily. Patient not taking: Reported on 10/04/2018 10/14/13   Muthersbaugh, Dahlia Client, PA-C  ibuprofen (ADVIL,MOTRIN) 800 MG tablet Take 1 tablet (800 mg total) by mouth every 8 (eight) hours as needed for moderate pain. Patient not taking: Reported on 10/04/2018 06/26/15   Earley Favor, NP  naphazoline-pheniramine (NAPHCON-A) 0.025-0.3 % ophthalmic solution Place 1 drop into both eyes every 6 (six) hours as needed for irritation. Patient not taking: Reported on 10/04/2018 10/14/13   Muthersbaugh, Dahlia Client, PA-C    Physical Exam: Vitals:   10/04/18 1942 10/04/18 1948 10/04/18 2000  BP: 131/67  124/69  Pulse: 94  93  Resp: (!) 23  18  Temp: 98.1 F (36.7 C)    TempSrc: Oral    SpO2: 98% 100% 99%  Weight: 113.4 kg    Height: 5\' 6"  (1.676 m)      Physical Exam  Constitutional: She is oriented to person, place, and time. She appears well-developed and well-nourished.  HENT:  Head: Normocephalic.  Mouth/Throat: Oropharynx is clear and moist.  Eyes: Right eye exhibits no discharge. Left eye exhibits no discharge.  Cardiovascular: Regular rhythm and intact distal pulses.  Tachycardic  Pulmonary/Chest: Effort normal. She has no wheezes. She has no rales.  Speaking clearly in full sentences No accessory muscle use  Abdominal: Soft. Bowel sounds are normal. She exhibits no distension. There is no abdominal tenderness. There is no guarding.  Musculoskeletal:        General: No edema.  Neurological: She is alert and oriented to person, place, and time.  Skin: Skin is warm and dry.     Labs on Admission: I have personally reviewed following labs and imaging studies  CBC: Recent Labs  Lab 10/04/18 2014  WBC 7.3  NEUTROABS 3.4  HGB 14.1  HCT 45.1  MCV 93.8  PLT 169   Basic Metabolic Panel: Recent Labs  Lab 10/04/18 1945  NA 140  K 3.4*  CL 108  CO2 23  GLUCOSE 115*  BUN 16    CREATININE 1.18*  CALCIUM 8.8*   GFR: Estimated Creatinine Clearance: 76.9 mL/min (A) (by C-G formula based on SCr of 1.18 mg/dL (H)). Liver Function Tests: No results for input(s): AST, ALT, ALKPHOS, BILITOT, PROT, ALBUMIN in the last 168 hours. No results for input(s): LIPASE, AMYLASE in the last 168 hours. No results for input(s): AMMONIA in the last 168 hours. Coagulation Profile: No results for input(s): INR, PROTIME in the last 168 hours. Cardiac Enzymes: No results for input(s): CKTOTAL, CKMB, CKMBINDEX, TROPONINI in the last 168 hours. BNP (last 3 results) No results for input(s): PROBNP in the last 8760 hours. HbA1C: No results for input(s): HGBA1C in the last 72 hours. CBG: No results for input(s): GLUCAP in the last 168 hours. Lipid Profile: No results for input(s): CHOL, HDL, LDLCALC, TRIG, CHOLHDL, LDLDIRECT in the last 72 hours. Thyroid Function Tests: No results for input(s): TSH, T4TOTAL, FREET4, T3FREE, THYROIDAB in the last 72 hours. Anemia Panel: No results for input(s): VITAMINB12, FOLATE, FERRITIN, TIBC, IRON, RETICCTPCT in the last 72 hours. Urine analysis:    Component Value Date/Time   COLORURINE YELLOW 01/03/2015 2051   APPEARANCEUR CLEAR 01/03/2015 2051   LABSPEC 1.008 01/03/2015 2051   PHURINE 5.0 01/03/2015 2051   GLUCOSEU NEGATIVE 01/03/2015 2051   HGBUR NEGATIVE 01/03/2015 2051   BILIRUBINUR NEGATIVE 01/03/2015 2051   KETONESUR NEGATIVE 01/03/2015 2051   PROTEINUR NEGATIVE 01/03/2015 2051   UROBILINOGEN 0.2 01/03/2015 2051   NITRITE NEGATIVE 01/03/2015 2051   LEUKOCYTESUR NEGATIVE 01/03/2015 2051    Radiological Exams on Admission: Dg Chest Portable 1 View  Result Date: 10/04/2018 CLINICAL DATA:  Initial evaluation for acute shortness of breath, wheezing, productive cough. EXAM: PORTABLE CHEST 1 VIEW COMPARISON:  Prior radiograph from 01/03/2015 FINDINGS: Cardiac and mediastinal silhouettes are stable in size and contour, and remain within  normal limits. Lungs are hypoinflated. Associated bibasilar bronchovascular crowding/atelectasis. No consolidative opacity. No edema or effusion. No pneumothorax. No acute osseous finding. IMPRESSION: 1. Shallow lung inflation with associated bibasilar bronchovascular crowding/atelectasis. 2. No other active cardiopulmonary disease identified. Electronically Signed   By: Rise MuBenjamin  McClintock M.D.   On: 10/04/2018 20:09    Assessment/Plan Principal Problem:   Asthma exacerbation Active Problems:   Influenza A   Hypokalemia   Acute asthma exacerbation likely precipitated by influenza A -3-day history of dyspnea, cough productive of yellow-colored sputum, wheezing, fatigue, dizziness, chills, decreased p.o. intake, and body aches. -Noted to be wheezing in the ED and received continuous albuterol nebulizer treatment in addition to Solu-Medrol 125 mg.  Currently not wheezing but tachycardic after receiving continuous neb.  Not hypoxic. -Afebrile and no leukocytosis.  Influenza A positive. -Duo nebs every 8 hours -Albuterol nebulizer every 2 hours as needed -Prednisone 40 mg daily starting in the morning -Tamiflu 75 mg twice daily -IV fluid hydration -Continuous pulse ox -Droplet precautions  Mild hypokalemia -Potassium 3.4.  Likely secondary to decreased p.o. intake. -Replete potassium.  Check magnesium level.  Continue to  monitor BMP.  DVT prophylaxis: Lovenox Code Status: Full code Family Communication: No family available. Disposition Plan: Anticipate discharge in 1 to 2 days. Consults called: None Admission status: Observation   John Giovanni MD [facility] Pager [phone]  If 7PM-7AM, please contact night-coverage [email or web address] Password [facility]  10/04/2018, 9:49 PM

## 2018-10-04 NOTE — ED Provider Notes (Signed)
[facility]-EMERGENCY DEPT Provider Note   CSN: [id number] Arrival date & time: 10/04/18  1929    History   Chief Complaint Chief Complaint  Patient presents with  . Shortness of Breath    HPI [name] is a 48 y.o. female.     Pt presents to the ED today with sob.  She has a hx of asthma.  She was seen at urgent care yesterday with sob.  She was given a decadron injection and an inhaler.  She said she is not any better.  She denies any f/c.      Past Medical History:  Diagnosis Date  . Asthma   . Infertility associated with anovulation     Patient Active Problem List   Diagnosis Date Noted  . Asthma exacerbation 10/04/2018    Past Surgical History:  Procedure Laterality Date  . PELVIC LAPAROSCOPY       OB History    Gravida  0   Para      Term      Preterm      AB      Living        SAB      TAB      Ectopic      Multiple      Live Births               Home Medications    Prior to Admission medications   Medication Sig Start Date End Date Taking? Authorizing Provider  ibuprofen (ADVIL,MOTRIN) 600 MG tablet Take 600 mg by mouth daily. 10/03/18  Yes [provider]  montelukast (SINGULAIR) 10 MG tablet Take 10 mg by mouth daily. 10/03/18  Yes [provider]  omeprazole (PRILOSEC) 20 MG capsule Take 20 mg by mouth daily. 08/09/17  Yes [provider]  PROAIR HFA 108 (90 Base) MCG/ACT inhaler Inhale 2 puffs into the lungs every 4 (four) hours as needed for wheezing or shortness of breath. 10/03/18  Yes [provider]  fluticasone (FLONASE) 50 MCG/ACT nasal spray Place 2 sprays into both nostrils daily. Patient not taking: Reported on 10/04/2018 10/14/13   Muthersbaugh, Dahlia Client, PA-C  ibuprofen (ADVIL,MOTRIN) 800 MG tablet Take 1 tablet (800 mg total) by mouth every 8 (eight) hours as needed for moderate pain. Patient not taking: Reported on 10/04/2018 06/26/15   Earley Favor, NP   naphazoline-pheniramine (NAPHCON-A) 0.025-0.3 % ophthalmic solution Place 1 drop into both eyes every 6 (six) hours as needed for irritation. Patient not taking: Reported on 10/04/2018 10/14/13   Muthersbaugh, Dahlia Client, PA-C    Family History No family history on file.  Social History Social History   Tobacco Use  . Smoking status: Never Smoker  . Smokeless tobacco: Never Used  Substance Use Topics  . Alcohol use: No  . Drug use: No     Allergies   Patient has no known allergies.   Review of Systems Review of Systems  Respiratory: Positive for cough, shortness of breath and wheezing.   All other systems reviewed and are negative.    Physical Exam Updated Vital Signs BP 124/69   Pulse 93   Temp 98.1 F (36.7 C) (Oral)   Resp 18   Ht 5\' 6"  (1.676 m)   Wt 113.4 kg   SpO2 99%   BMI 40.35 kg/m   Physical Exam Vitals signs and nursing note reviewed.  Constitutional:      Appearance: Normal appearance.  HENT:     Head:  Normocephalic and atraumatic.     Right Ear: External ear normal.     Left Ear: External ear normal.     Nose: Nose normal.     Mouth/Throat:     Mouth: Mucous membranes are moist.  Eyes:     Extraocular Movements: Extraocular movements intact.     Conjunctiva/sclera: Conjunctivae normal.     Pupils: Pupils are equal, round, and reactive to light.  Neck:     Musculoskeletal: Normal range of motion and neck supple.  Cardiovascular:     Rate and Rhythm: Regular rhythm. Tachycardia present.     Pulses: Normal pulses.     Heart sounds: Normal heart sounds.  Pulmonary:     Breath sounds: Wheezing present.  Abdominal:     General: Abdomen is flat.  Musculoskeletal: Normal range of motion.  Skin:    General: Skin is warm.     Capillary Refill: Capillary refill takes less than 2 seconds.  Neurological:     General: No focal deficit present.     Mental Status: She is alert and oriented to person, place, and time.  Psychiatric:        Mood and  Affect: Mood normal.        Behavior: Behavior normal.      ED Treatments / Results  Labs (all labs ordered are listed, but only abnormal results are displayed) Labs Reviewed  BASIC METABOLIC PANEL - Abnormal; Notable for the following components:      Result Value   Potassium 3.4 (*)    Glucose, Bld 115 (*)    Creatinine, Ser 1.18 (*)    Calcium 8.8 (*)    GFR calc non Af Amer 56 (*)    All other components within normal limits  INFLUENZA PANEL BY PCR (TYPE A & B) - Abnormal; Notable for the following components:   Influenza A By PCR POSITIVE (*)    All other components within normal limits  CBC WITH DIFFERENTIAL/PLATELET    EKG None  Radiology Dg Chest Portable 1 View  Result Date: 10/04/2018 CLINICAL DATA:  Initial evaluation for acute shortness of breath, wheezing, productive cough. EXAM: PORTABLE CHEST 1 VIEW COMPARISON:  Prior radiograph from 01/03/2015 FINDINGS: Cardiac and mediastinal silhouettes are stable in size and contour, and remain within normal limits. Lungs are hypoinflated. Associated bibasilar bronchovascular crowding/atelectasis. No consolidative opacity. No edema or effusion. No pneumothorax. No acute osseous finding. IMPRESSION: 1. Shallow lung inflation with associated bibasilar bronchovascular crowding/atelectasis. 2. No other active cardiopulmonary disease identified. Electronically Signed   By: Rise MuBenjamin  McClintock M.D.   On: 10/04/2018 20:09    Procedures Procedures (including critical care time)  Medications Ordered in ED Medications  albuterol (PROVENTIL,VENTOLIN) solution continuous neb (10 mg/hr Nebulization New Bag/Given 10/04/18 2028)  sodium chloride 0.9 % bolus 1,000 mL (0 mLs Intravenous Stopped 10/04/18 2122)  methylPREDNISolone sodium succinate (SOLU-MEDROL) 125 mg/2 mL injection 125 mg (125 mg Intravenous Given 10/04/18 1955)  oseltamivir (TAMIFLU) capsule 75 mg (75 mg Oral Given 10/04/18 2122)     Initial Impression / Assessment and  Plan / ED Course  I have reviewed the triage vital signs and the nursing notes.  Pertinent labs & imaging results that were available during my care of the patient were reviewed by me and considered in my medical decision making (see chart for details).      Pt's breathing has improved.  However, she is still sob with some wheezing.  She is also + for the flu.  Due to her respiratory issues plus flu, I treated her with tamiflu.  I also think she needs an observation as she has failed outpatient tx.  Pt d/w Dr. Loney Loh ([facility]) for admission.  Final Clinical Impressions(s) / ED Diagnoses   Final diagnoses:  Influenza A  Moderate persistent asthma with exacerbation    ED Discharge Orders    None       Jacalyn Lefevre, MD 10/04/18 2130

## 2018-10-04 NOTE — ED Notes (Signed)
Report given to Emily, RN.

## 2018-10-04 NOTE — ED Notes (Signed)
Bed: WA10 Expected date:  Expected time:  Means of arrival:  Comments: EMS-SOB 

## 2018-10-04 NOTE — ED Triage Notes (Signed)
C/C SOB x3 days, productive cough, wheezing all lobes, after DuoNeb patient is feeling much better per EMS.

## 2018-10-04 NOTE — ED Notes (Signed)
Respiratory called for breathing treatment.

## 2018-10-05 ENCOUNTER — Encounter (HOSPITAL_COMMUNITY): Payer: Self-pay

## 2018-10-05 DIAGNOSIS — J45901 Unspecified asthma with (acute) exacerbation: Secondary | ICD-10-CM | POA: Diagnosis not present

## 2018-10-05 DIAGNOSIS — J4541 Moderate persistent asthma with (acute) exacerbation: Secondary | ICD-10-CM

## 2018-10-05 LAB — BASIC METABOLIC PANEL
Anion gap: 7 (ref 5–15)
BUN: 12 mg/dL (ref 6–20)
CALCIUM: 8.3 mg/dL — AB (ref 8.9–10.3)
CO2: 19 mmol/L — ABNORMAL LOW (ref 22–32)
CREATININE: 0.8 mg/dL (ref 0.44–1.00)
Chloride: 113 mmol/L — ABNORMAL HIGH (ref 98–111)
GFR calc Af Amer: >60 mL/min (ref >60)
GFR calc non Af Amer: >60 mL/min (ref >60)
Glucose, Bld: 170 mg/dL — ABNORMAL HIGH (ref 70–99)
Potassium: 4.7 mmol/L (ref 3.5–5.1)
Sodium: 139 mmol/L (ref 135–145)

## 2018-10-05 MED ORDER — IPRATROPIUM-ALBUTEROL 0.5-2.5 (3) MG/3ML IN SOLN
3.0000 mL | Freq: Two times a day (BID) | RESPIRATORY_TRACT | Status: DC
  Administered 2018-10-05 – 2018-10-06 (×2): 3 mL via RESPIRATORY_TRACT
  Filled 2018-10-05 (×2): qty 3

## 2018-10-05 MED ORDER — FAMOTIDINE 20 MG PO TABS
20.0000 mg | ORAL_TABLET | Freq: Two times a day (BID) | ORAL | Status: DC
  Administered 2018-10-05 – 2018-10-06 (×2): 20 mg via ORAL
  Filled 2018-10-05 (×2): qty 1

## 2018-10-05 MED ORDER — ENOXAPARIN SODIUM 60 MG/0.6ML ~~LOC~~ SOLN: 60.0000 mg | SUBCUTANEOUS | Status: DC

## 2018-10-05 MED ORDER — METHYLPREDNISOLONE SODIUM SUCC 40 MG IJ SOLR
40.0000 mg | Freq: Two times a day (BID) | INTRAMUSCULAR | Status: DC
  Administered 2018-10-06: 40 mg via INTRAVENOUS
  Filled 2018-10-05: qty 1

## 2018-10-05 MED ORDER — SODIUM CHLORIDE 0.9 % IV SOLN
INTRAVENOUS | Status: DC
  Administered 2018-10-06: 03:00:00 via INTRAVENOUS

## 2018-10-05 MED ORDER — FAMOTIDINE 20 MG PO TABS: 20.0000 mg | ORAL_TABLET | Freq: Two times a day (BID) | ORAL | Status: DC

## 2018-10-05 NOTE — Progress Notes (Signed)
PROGRESS NOTE    [name]  FXJ:[id number] DOB: [birth year] DOA: 10/04/2018 PCP: System, Pcp Not In   Brief Narrative: 48 y.o. female with medical history significant of asthma presenting to the hospital for evaluation of shortness of breath and cough.  Patient reports 3-day history of dyspnea, cough productive of yellow-colored sputum, wheezing, fatigue, dizziness, chills, decreased p.o. intake, and body aches.  States her asthma has previously been well controlled and she normally only uses albuterol occasionally as needed.  However, symptoms have been worse for the past 3 days.  She went to an urgent care center yesterday and was given a steroid injection and a breathing treatment but she continued to be symptomatic this morning.  Denies any recent travel or exposure to any individual with coronavirus infection  Assessment & Plan:   Principal Problem:   Asthma exacerbation Active Problems:   Influenza A   Hypokalemia  Acute asthma exacerbation likely precipitated by influenza A -3-day history of dyspnea, cough productive of yellow-colored sputum, wheezing, fatigue, dizziness, chills, decreased p.o. intake, and body aches.  Patient continues with diffuse wheezing.  She is also tachycardic and hypotensive this morning at 97/73.  Would started on IV steroids and normal saline. -Tamiflu 75 mg twice daily -IV fluid hydration -Continuous pulse ox -Droplet precautions  Mild hypokalemia resolved.  Patient needs overnight stay for IV hydration with hypotension and tachycardia and IV steroids for wheezing.  DVT prophylaxis: Lovenox Code Status: Full code Family Communication: No family available. Disposition Plan: Anticipate discharge in 1  day Consults called: None Admission status: Observation    Estimated body mass index is 40.35 kg/m as calculated from the following:   Height as of this encounter: 5\' 6"  (1.676 m).   Weight as of this encounter: 113.4 kg.   Subjective:  Patient resting in bed complains of wheezing shortness of breath and low blood pressure. Objective: Vitals:   10/04/18 2200 10/04/18 2224 10/05/18 0647 10/05/18 1007  BP: (!) 129/56 125/63 97/73   Pulse: 87 (!) 102 83   Resp: 16 20 17    Temp:  98.1 F (36.7 C) 98.2 F (36.8 C)   TempSrc:      SpO2: 100% 99% 93% 98%  Weight:      Height:        Intake/Output Summary (Last 24 hours) at 10/05/2018 1059 Last data filed at 10/05/2018 0511 Gross per 24 hour  Intake 1827.65 ml  Output -  Net 1827.65 ml   Filed Weights   10/04/18 1942  Weight: 113.4 kg    Examination:  General exam: Appears calm and comfortable  Respiratory system: wheezing diffuse b/l to auscultation. Respiratory effort normal. Cardiovascular system: S1 & S2 heard, RRR. No JVD, murmurs, rubs, gallops or clicks. No pedal edema. Gastrointestinal system: Abdomen is nondistended, soft and nontender. No organomegaly or masses felt. Normal bowel sounds heard. Central nervous system: Alert and oriented. No focal neurological deficits. Extremities: Symmetric 5 x 5 power. Skin: No rashes, lesions or ulcers Psychiatry: Judgement and insight appear normal. Mood & affect appropriate.     Data Reviewed: I have personally reviewed following labs and imaging studies  CBC: Recent Labs  Lab 10/04/18 2014  WBC 7.3  NEUTROABS 3.4  HGB 14.1  HCT 45.1  MCV 93.8  PLT 169   Basic Metabolic Panel: Recent Labs  Lab 10/04/18 1945 10/05/18 0632  NA 140 139  K 3.4* 4.7  CL 108 113*  CO2 23 19*  GLUCOSE 115* 170*  BUN 16  12  CREATININE 1.18* 0.80  CALCIUM 8.8* 8.3*  MG 2.7*  --    GFR: Estimated Creatinine Clearance: 113.4 mL/min (by C-G formula based on SCr of 0.8 mg/dL). Liver Function Tests: No results for input(s): AST, ALT, ALKPHOS, BILITOT, PROT, ALBUMIN in the last 168 hours. No results for input(s): LIPASE, AMYLASE in the last 168 hours. No results for input(s): AMMONIA in the last 168 hours. Coagulation  Profile: No results for input(s): INR, PROTIME in the last 168 hours. Cardiac Enzymes: No results for input(s): CKTOTAL, CKMB, CKMBINDEX, TROPONINI in the last 168 hours. BNP (last 3 results) No results for input(s): PROBNP in the last 8760 hours. HbA1C: No results for input(s): HGBA1C in the last 72 hours. CBG: No results for input(s): GLUCAP in the last 168 hours. Lipid Profile: No results for input(s): CHOL, HDL, LDLCALC, TRIG, CHOLHDL, LDLDIRECT in the last 72 hours. Thyroid Function Tests: No results for input(s): TSH, T4TOTAL, FREET4, T3FREE, THYROIDAB in the last 72 hours. Anemia Panel: No results for input(s): VITAMINB12, FOLATE, FERRITIN, TIBC, IRON, RETICCTPCT in the last 72 hours. Sepsis Labs: No results for input(s): PROCALCITON, LATICACIDVEN in the last 168 hours.  No results found for this or any previous visit (from the past 240 hour(s)).       Radiology Studies: Dg Chest Portable 1 View  Result Date: 10/04/2018 CLINICAL DATA:  Initial evaluation for acute shortness of breath, wheezing, productive cough. EXAM: PORTABLE CHEST 1 VIEW COMPARISON:  Prior radiograph from 01/03/2015 FINDINGS: Cardiac and mediastinal silhouettes are stable in size and contour, and remain within normal limits. Lungs are hypoinflated. Associated bibasilar bronchovascular crowding/atelectasis. No consolidative opacity. No edema or effusion. No pneumothorax. No acute osseous finding. IMPRESSION: 1. Shallow lung inflation with associated bibasilar bronchovascular crowding/atelectasis. 2. No other active cardiopulmonary disease identified. Electronically Signed   By: Rise Mu M.D.   On: 10/04/2018 20:09        Scheduled Meds: . enoxaparin (LOVENOX) injection  60 mg Subcutaneous Q24H  . ipratropium-albuterol  3 mL Nebulization BID  . oseltamivir  75 mg Oral BID  . predniSONE  40 mg Oral Q breakfast   Continuous Infusions:   LOS: 0 days     Alwyn Ren, MD [facility]  If 7PM-7AM, please contact night-coverage [email or web address] Password [facility] 10/05/2018, 10:59 AM

## 2018-10-06 DIAGNOSIS — J101 Influenza due to other identified influenza virus with other respiratory manifestations: Secondary | ICD-10-CM | POA: Diagnosis not present

## 2018-10-06 DIAGNOSIS — J45901 Unspecified asthma with (acute) exacerbation: Secondary | ICD-10-CM | POA: Diagnosis not present

## 2018-10-06 MED ORDER — ACETAMINOPHEN 325 MG PO TABS: 650.0000 mg | ORAL_TABLET | ORAL | Status: AC | PRN

## 2018-10-06 MED ORDER — PREDNISONE 10 MG PO TABS: ORAL_TABLET | ORAL | 0 refills | Status: DC

## 2018-10-06 MED ORDER — OSELTAMIVIR PHOSPHATE 75 MG PO CAPS: 75.0000 mg | ORAL_CAPSULE | Freq: Two times a day (BID) | ORAL | 0 refills | Status: DC

## 2018-10-06 NOTE — Discharge Summary (Signed)
Physician Discharge Summary  [name] MWU:[id number] DOB: 02-24-1971 DOA: 10/04/2018  PCP: System, Pcp Not In  Admit date: 10/04/2018 Discharge date: 10/06/2018  Admitted From home Disposition:  home Recommendations for Outpatient Follow-up:  1. Follow up with PCP in 1-2 weeks 2. Please obtain BMP/CBC in one week   Home Health none Equipment/Devices:none Discharge Condition:stable CODE STATUS:full Diet recommendation: cardiac  Brief/Interim Summary:47 y.o.femalewith medical history significant ofasthma presenting to the hospital for evaluation of shortness of breath and cough. Patient reports 3-day history of dyspnea, cough productive of yellow-colored sputum, wheezing, fatigue, dizziness, chills, decreased p.o. intake, and body aches. States her asthma has previously been well controlled and she normally only uses albuterol occasionally as needed. However, symptoms have been worse for the past 3 days. She went to an urgent care center yesterday and was given a steroid injection and a breathing treatment but she continued to be symptomatic this morning. Denies any recent travel or exposure to any individual with coronavirus infection  Discharge Diagnoses:  Principal Problem:   Asthma exacerbation Active Problems:   Influenza A   Hypokalemia   Acute asthma exacerbation l precipitated by influenzaA -3-day history of dyspnea, cough productive of yellow-colored sputum, wheezing, fatigue, dizziness, chills, decreased p.o. intake, and body aches.   She was treated with IV fluids and IV steroids.  She will be discharged home on Tamiflu and p.o. steroids tapering dose.  She does have a primary care physician she does not know the name but she is going to follow-up with him  Mild hypokalemia resolved.  Estimated body mass index is 40.35 kg/m as calculated from the following:   Height as of this encounter: 5\' 6"  (1.676 m).   Weight as of this encounter: 113.4  kg.  Discharge Instructions  Discharge Instructions    Call MD for:  difficulty breathing, headache or visual disturbances   Complete by:  As directed    Call MD for:  persistant nausea and vomiting   Complete by:  As directed    Call MD for:  severe uncontrolled pain   Complete by:  As directed    Call MD for:  temperature >100.4   Complete by:  As directed    Diet - low sodium heart healthy   Complete by:  As directed    Increase activity slowly   Complete by:  As directed      Allergies as of 10/06/2018   No Known Allergies     Medication List    STOP taking these medications   fluticasone 50 MCG/ACT nasal spray Commonly known as:  FLONASE   ibuprofen 600 MG tablet Commonly known as:  ADVIL,MOTRIN   ibuprofen 800 MG tablet Commonly known as:  ADVIL,MOTRIN   naphazoline-pheniramine 0.025-0.3 % ophthalmic solution Commonly known as:  NAPHCON-A     TAKE these medications   acetaminophen 325 MG tablet Commonly known as:  TYLENOL Take 2 tablets (650 mg total) by mouth every 4 (four) hours as needed for mild pain, fever or headache.   montelukast 10 MG tablet Commonly known as:  SINGULAIR Take 10 mg by mouth daily.   omeprazole 20 MG capsule Commonly known as:  PRILOSEC Take 20 mg by mouth daily.   oseltamivir 75 MG capsule Commonly known as:  TAMIFLU Take 1 capsule (75 mg total) by mouth 2 (two) times daily.   predniSONE 10 MG tablet Commonly known as:  DELTASONE Take 3 tabs daily for 3 days then 2 tabs daily for 2 days  then 1 tab daily till done   ProAir HFA 108 (90 Base) MCG/ACT inhaler Generic drug:  albuterol Inhale 2 puffs into the lungs every 4 (four) hours as needed for wheezing or shortness of breath.       No Known Allergies  Consultations: None  Procedures/Studies: Dg Chest Portable 1 View  Result Date: 10/04/2018 CLINICAL DATA:  Initial evaluation for acute shortness of breath, wheezing, productive cough. EXAM: PORTABLE CHEST 1 VIEW  COMPARISON:  Prior radiograph from 01/03/2015 FINDINGS: Cardiac and mediastinal silhouettes are stable in size and contour, and remain within normal limits. Lungs are hypoinflated. Associated bibasilar bronchovascular crowding/atelectasis. No consolidative opacity. No edema or effusion. No pneumothorax. No acute osseous finding. IMPRESSION: 1. Shallow lung inflation with associated bibasilar bronchovascular crowding/atelectasis. 2. No other active cardiopulmonary disease identified. Electronically Signed   By: Rise Mu M.D.   On: 10/04/2018 20:09    (Echo, Carotid, EGD, Colonoscopy, ERCP)    Subjective: Resting in bed in no acute distress staff reports no active issues overnight she slept well she has not had a bowel movement she has chronic constipation.  But her breathing seems to be improved cough is better wheezing is better.  Discharge Exam: Vitals:   10/05/18 2044 10/06/18 0521  BP:  124/67  Pulse:  62  Resp:  19  Temp:  97.7 F (36.5 C)  SpO2: 100% 99%   Vitals:   10/05/18 1457 10/05/18 2041 10/05/18 2044 10/06/18 0521  BP: 116/60 (!) 132/93  124/67  Pulse: 73 67  62  Resp: Temp: 98.5 F (36.9 C) 97.8 F (36.6 C)  97.7 F (36.5 C)  TempSrc:      SpO2: 99% 99% 100% 99%  Weight:      Height:        General: Pt is alert, awake, not in acute distress Cardiovascular: RRR, S1/S2 +, no rubs, no gallops Respiratory: Scattered wheezing bilaterally, no wheezing, no rhonchi Abdominal: Soft, NT, ND, bowel sounds + Extremities: no edema, no cyanosis    The results of significant diagnostics from this hospitalization (including imaging, microbiology, ancillary and laboratory) are listed below for reference.     Microbiology: No results found for this or any previous visit (from the past 240 hour(s)).   Labs: BNP (last 3 results) No results for input(s): BNP in the last 8760 hours. Basic Metabolic Panel: Recent Labs  Lab 10/04/18 1945  10/05/18 0632  NA 140 139  K 3.4* 4.7  CL 108 113*  CO2 23 19*  GLUCOSE 115* 170*  BUN 16 12  CREATININE 1.18* 0.80  CALCIUM 8.8* 8.3*  MG 2.7*  --    Liver Function Tests: No results for input(s): AST, ALT, ALKPHOS, BILITOT, PROT, ALBUMIN in the last 168 hours. No results for input(s): LIPASE, AMYLASE in the last 168 hours. No results for input(s): AMMONIA in the last 168 hours. CBC: Recent Labs  Lab 10/04/18 2014  WBC 7.3  NEUTROABS 3.4  HGB 14.1  HCT 45.1  MCV 93.8  PLT 169   Cardiac Enzymes: No results for input(s): CKTOTAL, CKMB, CKMBINDEX, TROPONINI in the last 168 hours. BNP: Invalid input(s): POCBNP CBG: No results for input(s): GLUCAP in the last 168 hours. D-Dimer No results for input(s): DDIMER in the last 72 hours. Hgb A1c No results for input(s): HGBA1C in the last 72 hours. Lipid Profile No results for input(s): CHOL, HDL, LDLCALC, TRIG, CHOLHDL, LDLDIRECT in the last 72 hours. Thyroid function studies No results  for input(s): TSH, T4TOTAL, T3FREE, THYROIDAB in the last 72 hours.  Invalid input(s): FREET3 Anemia work up No results for input(s): VITAMINB12, FOLATE, FERRITIN, TIBC, IRON, RETICCTPCT in the last 72 hours. Urinalysis    Component Value Date/Time   COLORURINE YELLOW 01/03/2015 2051   APPEARANCEUR CLEAR 01/03/2015 2051   LABSPEC 1.008 01/03/2015 2051   PHURINE 5.0 01/03/2015 2051   GLUCOSEU NEGATIVE 01/03/2015 2051   HGBUR NEGATIVE 01/03/2015 2051   BILIRUBINUR NEGATIVE 01/03/2015 2051   KETONESUR NEGATIVE 01/03/2015 2051   PROTEINUR NEGATIVE 01/03/2015 2051   UROBILINOGEN 0.2 01/03/2015 2051   NITRITE NEGATIVE 01/03/2015 2051   LEUKOCYTESUR NEGATIVE 01/03/2015 2051   Sepsis Labs Invalid input(s): PROCALCITONIN,  WBC,  LACTICIDVEN Microbiology No results found for this or any previous visit (from the past 240 hour(s)).   Time coordinating discharge 34  minutes  SIGNED:   Alwyn Ren, MD  [facility] 10/06/2018, 8:59 AM Pager   If 7PM-7AM, please contact night-coverage [email or web address] Password [email or web address]

## 2019-08-17 ENCOUNTER — Emergency Department (HOSPITAL_COMMUNITY): Payer: 59

## 2019-08-17 ENCOUNTER — Emergency Department (HOSPITAL_COMMUNITY)
Admission: EM | Admit: 2019-08-17 | Discharge: 2019-08-17 | Disposition: A | Discharge status: Home / Self Care | Payer: 59 | Attending: Emergency Medicine | Admitting: Emergency Medicine

## 2019-08-17 ENCOUNTER — Encounter (HOSPITAL_COMMUNITY): Payer: Self-pay | Admitting: *Deleted

## 2019-08-17 ENCOUNTER — Other Ambulatory Visit: Payer: Self-pay

## 2019-08-17 DIAGNOSIS — M542 Cervicalgia: Secondary | ICD-10-CM | POA: Insufficient documentation

## 2019-08-17 DIAGNOSIS — H6690 Otitis media, unspecified, unspecified ear: Secondary | ICD-10-CM

## 2019-08-17 DIAGNOSIS — H9202 Otalgia, left ear: Secondary | ICD-10-CM | POA: Diagnosis present

## 2019-08-17 DIAGNOSIS — J45909 Unspecified asthma, uncomplicated: Secondary | ICD-10-CM | POA: Insufficient documentation

## 2019-08-17 DIAGNOSIS — H6692 Otitis media, unspecified, left ear: Secondary | ICD-10-CM | POA: Diagnosis not present

## 2019-08-17 DIAGNOSIS — Z79899 Other long term (current) drug therapy: Secondary | ICD-10-CM | POA: Diagnosis not present

## 2019-08-17 MED ORDER — AZITHROMYCIN 250 MG PO TABS: 250.0000 mg | ORAL_TABLET | Freq: Every day | ORAL | 0 refills | Status: DC

## 2019-08-17 MED ORDER — PREDNISONE 20 MG PO TABS: 40.0000 mg | ORAL_TABLET | Freq: Every day | ORAL | 0 refills | Status: DC

## 2019-08-17 NOTE — ED Triage Notes (Signed)
Pt come in due to Asthma and ear pain. She states she has had problems after her Covid + test. She did isolate and followed guidelines for Covid +. Continues to have more noted breathing problems at night, is not helping a whole lot. Pt able to speak in full sentences in triage without noted SHOB.

## 2019-08-22 NOTE — ED Provider Notes (Signed)
[facility]-EMERGENCY DEPT Provider Note   CSN: [id number] Arrival date & time: 08/17/19  1527     History Chief Complaint  Patient presents with  . Asthma  . Otalgia    L    [name] is a 49 y.o. female.  HPI   49 year old female with left neck and left ear pain.  Onset a couple days ago.  Persistent since.  Increasing pain with swallowing.  Reports recent Covid positive.  Mild shortness of breath and mild cough.  Nonproductive.  No fevers.  Feels somewhat achy and tired.  No vomiting or diarrhea.  Past Medical History:  Diagnosis Date  . Asthma   . Infertility associated with anovulation     Patient Active Problem List   Diagnosis Date Noted  . Asthma exacerbation 10/04/2018  . Influenza A 10/04/2018  . Hypokalemia 10/04/2018    Past Surgical History:  Procedure Laterality Date  . PELVIC LAPAROSCOPY       OB History    Gravida  0   Para      Term      Preterm      AB      Living        SAB      TAB      Ectopic      Multiple      Live Births              No family history on file.  Social History   Tobacco Use  . Smoking status: Never Smoker  . Smokeless tobacco: Never Used  Substance Use Topics  . Alcohol use: No  . Drug use: No    Home Medications Prior to Admission medications   Medication Sig Start Date End Date Taking? Authorizing Provider  acetaminophen (TYLENOL) 325 MG tablet Take 2 tablets (650 mg total) by mouth every 4 (four) hours as needed for mild pain, fever or headache. 10/06/18  Yes Alwyn Ren, MD  budesonide-formoterol [facility]) 160-4.5 MCG/ACT inhaler Inhale 2 puffs into the lungs 2 (two) times daily.   Yes [provider]  DOTTI 0.1 MG/24HR patch Place 1 patch onto the skin every 3 (three) days. 07/22/19  Yes [provider]  estradiol (ESTRACE) 2 MG tablet Take 2 mg by mouth 2 (two) times daily. 07/31/19  Yes [provider]  omeprazole  (PRILOSEC) 20 MG capsule Take 20 mg by mouth daily as needed (heartburn).  08/09/17  Yes [provider]  PROAIR HFA 108 (90 Base) MCG/ACT inhaler Inhale 2 puffs into the lungs every 4 (four) hours as needed for wheezing or shortness of breath. 10/03/18  Yes [provider]  azithromycin (ZITHROMAX Z-PAK) 250 MG tablet Take 1 tablet (250 mg total) by mouth daily. 2 pills (500mg ) day 1 then 1 pill (250mg ) days 2-5. 08/17/19   , MD  predniSONE (DELTASONE) 20 MG tablet Take 2 tablets (40 mg total) by mouth daily. 08/17/19   Raeford Razor, MD    Allergies    Patient has no known allergies.  Review of Systems   Review of Systems  Physical Exam Updated Vital Signs BP (!) 113/59 (BP Location: Left Arm)   Pulse 80   Temp 98.4 F (36.9 C) (Oral)   Resp 18   Ht 5\' 6"  (1.676 m)   Wt 111.6 kg   SpO2 100%   BMI 39.71 kg/m   Physical Exam Vitals and nursing note reviewed.  Constitutional:  General: She is not in acute distress.    Appearance: She is well-developed.  HENT:     Head: Normocephalic and atraumatic.     Comments: Tenderness to palpation along the left anterior to lateral neck.  No adenopathy appreciated.  No swelling.  Neck is supple.  Normal sounding voice.  Posterior pharynx is clear.  Left otitis media without perforation.  No mastoid tenderness. Eyes:     General:        Right eye: No discharge.        Left eye: No discharge.     Conjunctiva/sclera: Conjunctivae normal.  Cardiovascular:     Rate and Rhythm: Normal rate and regular rhythm.     Heart sounds: Normal heart sounds. No murmur. No friction rub. No gallop.   Pulmonary:     Effort: Pulmonary effort is normal. No respiratory distress.     Breath sounds: Normal breath sounds.  Abdominal:     General: There is no distension.     Palpations: Abdomen is soft.     Tenderness: There is no abdominal tenderness.  Musculoskeletal:     Cervical back: Neck supple. Tenderness present.    Skin:    General: Skin is warm and dry.  Neurological:     Mental Status: She is alert.  Psychiatric:        Behavior: Behavior normal.        Thought Content: Thought content normal.     ED Results / Procedures / Treatments   Labs (all labs ordered are listed, but only abnormal results are displayed) Labs Reviewed - No data to display  EKG None  Radiology No results found.  Procedures Procedures (including critical care time)  Medications Ordered in ED Medications - No data to display  ED Course  I have reviewed the triage vital signs and the nursing notes.  Pertinent labs & imaging results that were available during my care of the patient were reviewed by me and considered in my medical decision making (see chart for details).    MDM Rules/Calculators/A&P                      49 year old female with left neck and ear pain.  Otitis media on exam.  Some tenderness along the left anterior neck.  No adenopathy appreciated.  Her neck is supple though.  Doubt meningitis.  Airway is fine.  Will place her on azithromycin.  Return precautions discussed.  Outpatient follow-up otherwise.  [name] was evaluated in Emergency Department on 08/22/2019 for the symptoms described in the history of present illness. She was evaluated in the context of the global COVID-19 pandemic, which necessitated consideration that the patient might be at risk for infection with the SARS-CoV-2 virus that causes COVID-19. Institutional protocols and algorithms that pertain to the evaluation of patients at risk for COVID-19 are in a state of rapid change based on information released by regulatory bodies including the CDC and federal and state organizations. These policies and algorithms were followed during the patient's care in the ED.  Final Clinical Impression(s) / ED Diagnoses Final diagnoses:  Acute otitis media, unspecified otitis media type  Neck pain    Rx / DC Orders ED Discharge  Orders         Ordered    azithromycin (ZITHROMAX Z-PAK) 250 MG tablet  Daily,   Status:  Discontinued     08/17/19 2139    azithromycin (ZITHROMAX Z-PAK) 250 MG tablet  Daily  08/17/19 2152    predniSONE (DELTASONE) 20 MG tablet  Daily     08/17/19 2152           Raeford Razor, MD 08/22/19 [phone]

## 2020-02-06 ENCOUNTER — Other Ambulatory Visit: Payer: 59

## 2020-02-07 ENCOUNTER — Other Ambulatory Visit: Payer: Self-pay

## 2023-05-19 ENCOUNTER — Encounter (HOSPITAL_COMMUNITY): Payer: Self-pay

## 2023-05-19 ENCOUNTER — Ambulatory Visit (HOSPITAL_COMMUNITY)
Admission: EM | Admit: 2023-05-19 | Discharge: 2023-05-19 | Disposition: A | Discharge status: Home / Self Care | Payer: BLUE CROSS/BLUE SHIELD | Attending: Emergency Medicine | Admitting: Emergency Medicine

## 2023-05-19 DIAGNOSIS — K6289 Other specified diseases of anus and rectum: Secondary | ICD-10-CM | POA: Diagnosis present

## 2023-05-19 DIAGNOSIS — Z113 Encounter for screening for infections with a predominantly sexual mode of transmission: Secondary | ICD-10-CM | POA: Insufficient documentation

## 2023-05-19 DIAGNOSIS — R109 Unspecified abdominal pain: Secondary | ICD-10-CM | POA: Insufficient documentation

## 2023-05-19 DIAGNOSIS — N898 Other specified noninflammatory disorders of vagina: Secondary | ICD-10-CM | POA: Insufficient documentation

## 2023-05-19 DIAGNOSIS — K5900 Constipation, unspecified: Secondary | ICD-10-CM | POA: Diagnosis not present

## 2023-05-19 DIAGNOSIS — K644 Residual hemorrhoidal skin tags: Secondary | ICD-10-CM | POA: Insufficient documentation

## 2023-05-19 LAB — POCT URINE PREGNANCY: Preg Test, Ur: NEGATIVE

## 2023-05-19 LAB — POCT URINALYSIS DIP (MANUAL ENTRY)
Bilirubin, UA: NEGATIVE
Blood, UA: NEGATIVE
Glucose, UA: NEGATIVE mg/dL
Ketones, POC UA: NEGATIVE mg/dL
Leukocytes, UA: NEGATIVE
Nitrite, UA: NEGATIVE
Protein Ur, POC: NEGATIVE mg/dL
Spec Grav, UA: 1.02 (ref 1.010–1.025)
Urobilinogen, UA: 0.2 U/dL
pH, UA: 5.5 (ref 5.0–8.0)

## 2023-05-19 MED ORDER — HYDROCORTISONE (PERIANAL) 2.5 % EX CREA: 1.0000 | TOPICAL_CREAM | Freq: Two times a day (BID) | CUTANEOUS | 0 refills | Status: AC

## 2023-05-19 MED ORDER — FLUCONAZOLE 150 MG PO TABS: 150.0000 mg | ORAL_TABLET | ORAL | 0 refills | Status: AC | PRN

## 2023-05-19 MED ORDER — HYDROCORTISONE ACETATE 25 MG RE SUPP: 25.0000 mg | Freq: Two times a day (BID) | RECTAL | 0 refills | Status: AC

## 2023-05-19 NOTE — ED Provider Notes (Signed)
[facility]    CSN: [id number] Arrival date & time: 05/19/23  1219      History   Chief Complaint Chief Complaint  Patient presents with   Hemorrhoids    HPI [name] is a 52 y.o. female.   Patient presents today with several concerns.  She reports that for the past 2 weeks she has had painful external hemorrhoid.  She reports the pain is rated 10 on a 0-10 pain scale, worse with bowel movement, no alleviating factors notified.  She reports associated burning and stinging particularly after a bowel movement.  She has tried topical and suppository medication over-the-counter without improvement.  She has had hemorrhoids in the past but they have never lasted this long.  She has not seen colorectal surgery had any kind of surgery.  She does report that she has had an increase in abdominal discomfort and constipation since taking antibiotic several weeks ago.  Reports that she had a small bowel movement this morning but it was hard and difficult to pass which exacerbated her hemorrhoid symptoms.  She denies any nausea, vomiting, fever, blood in her stool, melena.  In addition, she reports vaginal itching and discharge.  She has no concern for STI.  She was treated for an infection with doxycycline several weeks ago but does not believe that her symptoms started at this time.  She has tried Monistat and over-the-counter medications without improvement of symptoms.  She denies history of diabetes and does not take SGLT2 inhibitor.    Past Medical History:  Diagnosis Date   Asthma    Infertility associated with anovulation     Patient Active Problem List   Diagnosis Date Noted   Asthma exacerbation 10/04/2018   Influenza A 10/04/2018   Hypokalemia 10/04/2018    Past Surgical History:  Procedure Laterality Date   PELVIC LAPAROSCOPY      OB History     Gravida  0   Para      Term      Preterm      AB      Living         SAB      IAB       Ectopic      Multiple      Live Births               Home Medications    Prior to Admission medications   Medication Sig Start Date End Date Taking? Authorizing Provider  fluconazole (DIFLUCAN) 150 MG tablet Take 1 tablet (150 mg total) by mouth every 3 (three) days as needed for up to 2 doses. 05/19/23  Yes Mohd. Derflinger, Noberto Retort, PA-C  hydrocortisone (ANUSOL-HC) 2.5 % rectal cream Place 1 Application rectally 2 (two) times daily. 05/19/23  Yes Bailyn Spackman, Noberto Retort, PA-C  hydrocortisone (ANUSOL-HC) 25 MG suppository Place 1 suppository (25 mg total) rectally 2 (two) times daily. 05/19/23  Yes Alexandr Oehler, Noberto Retort, PA-C  acetaminophen (TYLENOL) 325 MG tablet Take 2 tablets (650 mg total) by mouth every 4 (four) hours as needed for mild pain, fever or headache. 10/06/18   Alwyn Ren, MD  budesonide-formoterol [facility]) 160-4.5 MCG/ACT inhaler Inhale 2 puffs into the lungs 2 (two) times daily.    [provider]  omeprazole (PRILOSEC) 20 MG capsule Take 20 mg by mouth daily as needed (heartburn).  08/09/17   [provider]  PROAIR HFA 108 (90 Base) MCG/ACT inhaler Inhale 2 puffs into the lungs every 4 (  four) hours as needed for wheezing or shortness of breath. 10/03/18   [provider]    Family History History reviewed. No pertinent family history.  Social History Social History   Tobacco Use   Smoking status: Never   Smokeless tobacco: Never  Substance Use Topics   Alcohol use: No   Drug use: No     Allergies   Patient has no known allergies.   Review of Systems Review of Systems  Constitutional:  Positive for activity change. Negative for appetite change, fatigue and fever.  Gastrointestinal:  Positive for constipation and rectal pain. Negative for abdominal pain, blood in stool, diarrhea, nausea and vomiting.  Genitourinary:  Positive for vaginal discharge and vaginal pain. Negative for dysuria, frequency, pelvic pain, urgency and vaginal bleeding.   Musculoskeletal:  Negative for arthralgias and myalgias.     Physical Exam Triage Vital Signs ED Triage Vitals  Encounter Vitals Group     BP 05/19/23 1420 121/70     Systolic BP Percentile --      Diastolic BP Percentile --      Pulse Rate 05/19/23 1420 78     Resp 05/19/23 1420 18     Temp 05/19/23 1420 97.8 F (36.6 C)     Temp src --      SpO2 05/19/23 1420 98 %     Weight --      Height --      Head Circumference --      Peak Flow --      Pain Score 05/19/23 1418 3     Pain Loc --      Pain Education --      Exclude from Growth Chart --    No data found.  Updated Vital Signs BP 121/70   Pulse 78   Temp 97.8 F (36.6 C)   Resp 18   LMP 04/06/2023   SpO2 98%   Visual Acuity Right Eye Distance:   Left Eye Distance:   Bilateral Distance:    Right Eye Near:   Left Eye Near:    Bilateral Near:     Physical Exam Vitals reviewed. Exam conducted with a chaperone present.  Constitutional:      General: She is awake. She is not in acute distress.    Appearance: Normal appearance. She is well-developed. She is not ill-appearing.     Comments: Very pleasant female appears stated age in no acute distress sitting comfortably in exam room  HENT:     Head: Normocephalic and atraumatic.  Cardiovascular:     Rate and Rhythm: Normal rate and regular rhythm.     Heart sounds: Normal heart sounds, S1 normal and S2 normal. No murmur heard. Pulmonary:     Effort: Pulmonary effort is normal.     Breath sounds: Normal breath sounds. No wheezing, rhonchi or rales.     Comments: Clear to auscultation bilaterally Abdominal:     General: Bowel sounds are normal.     Palpations: Abdomen is soft.     Tenderness: There is no abdominal tenderness. There is no right CVA tenderness, left CVA tenderness, guarding or rebound.     Comments: Benign abdominal exam  Genitourinary:    Labia:        Right: No rash or tenderness.        Left: No rash or tenderness.      Vagina:  Vaginal discharge present.     Cervix: Normal.     Uterus: Normal.  Adnexa: Right adnexa normal and left adnexa normal.     Rectum: External hemorrhoid present. No anal fissure or internal hemorrhoid.     Comments: [name], CMA present as chaperone during exam.  Thick white vaginal discharge noted posterior vaginal vault and on vaginal walls.  No CMT tenderness or adnexal tenderness on exam.  External hemorrhoid noted 1 o'clock position that is tender to palpation with no evidence of thrombosis. Psychiatric:        Behavior: Behavior is cooperative.      UC Treatments / Results  Labs (all labs ordered are listed, but only abnormal results are displayed) Labs Reviewed  POCT URINALYSIS DIP (MANUAL ENTRY) - Abnormal; Notable for the following components:      Result Value   Clarity, UA cloudy (*)    All other components within normal limits  POCT URINE PREGNANCY  CERVICOVAGINAL ANCILLARY ONLY    EKG   Radiology No results found.  Procedures Procedures (including critical care time)  Medications Ordered in UC Medications - No data to display  Initial Impression / Assessment and Plan / UC Course  I have reviewed the triage vital signs and the nursing notes.  Pertinent labs & imaging results that were available during my care of the patient were reviewed by me and considered in my medical decision making (see chart for details).     Patient is well-appearing, afebrile, nontoxic, nontachycardic.  External hemorrhoid noted on exam we discussed this is likely cause of her symptoms.  No evidence of thrombosis.  She was encouraged to use hydrocortisone suppositories and cream to help manage the symptoms.  She can alternate Tylenol and ibuprofen for pain relief.  Discussed the importance of avoiding constipation and she was encouraged to use MiraLAX as well as increase the fiber and water in her diet with a goal of 1 soft easy to pass bowel movement per 24 hours.  Discussed that  if her symptoms are not improving she should follow-up with colorectal surgery was given contact information for local provider with instruction to call to schedule appointment.  We discussed that constipation could be also causing her abdominal discomfort and I am hopeful that this will improve with treatment of the constipation.  Discussed that if anything worsens or changes she needs to be seen immediately.  Strict return precautions given.  Work excuse note provided.  Vaginal discharge consistent with yeast vaginitis.  STI swab was collected and is pending.  Will empirically treat with Diflucan 1 dose today and a second dose in 72 hours if her symptoms are not resolved.  We will contact her if we need to arrange any additional treatment.  UA was obtained that was normal and urine pregnancy was negative.  No evidence of PID on exam.  Discussed that she should use hypoallergenic soaps and detergents and wear loosefitting cotton underwear.  If she has any worsening or changing symptoms she needs to be seen immediately.  Strict return precautions given.  Work excuse note provided.  Final Clinical Impressions(s) / UC Diagnoses   Final diagnoses:  External hemorrhoid  Rectal or anal pain  Vaginal irritation  Vaginal discharge     Discharge Instructions      You have an external hemorrhoid that I believe is contributing to most of your symptoms.  Use suppositories and cream to help with your symptoms.  It is very important that you avoid constipation so please use MiraLAX daily until you are having 1 soft easy to pass bowel movement  per day.  Make sure you are drinking plenty of fluid and eat fiber in your diet.  If your symptoms are not improving please follow-up with colorectal surgery.  If anything worsens and you have extreme pain, blood in your stool, difficulty passing bowel movements you need to be seen immediately.  I believe that you have a yeast infection.  Start Diflucan 1 dose today and a  second dose in 3 days.  I will contact you if any of your testing is abnormal and we need to add additional treatment to our plan.  Wear loosefitting underwear and use hypoallergenic soaps and detergents.  If anything worsens please return for reevaluation.     ED Prescriptions     Medication Sig Dispense Auth. Provider   fluconazole (DIFLUCAN) 150 MG tablet Take 1 tablet (150 mg total) by mouth every 3 (three) days as needed for up to 2 doses. 2 tablet Karrin Eisenmenger K, PA-C   hydrocortisone (ANUSOL-HC) 25 MG suppository Place 1 suppository (25 mg total) rectally 2 (two) times daily. 12 suppository Sumit Branham K, PA-C   hydrocortisone (ANUSOL-HC) 2.5 % rectal cream Place 1 Application rectally 2 (two) times daily. 30 g Tareek Sabo, Noberto Retort, PA-C      PDMP not reviewed this encounter.   Jeani Hawking, PA-C 05/19/23 1530

## 2023-05-19 NOTE — Discharge Instructions (Signed)
You have an external hemorrhoid that I believe is contributing to most of your symptoms.  Use suppositories and cream to help with your symptoms.  It is very important that you avoid constipation so please use MiraLAX daily until you are having 1 soft easy to pass bowel movement per day.  Make sure you are drinking plenty of fluid and eat fiber in your diet.  If your symptoms are not improving please follow-up with colorectal surgery.  If anything worsens and you have extreme pain, blood in your stool, difficulty passing bowel movements you need to be seen immediately.  I believe that you have a yeast infection.  Start Diflucan 1 dose today and a second dose in 3 days.  I will contact you if any of your testing is abnormal and we need to add additional treatment to our plan.  Wear loosefitting underwear and use hypoallergenic soaps and detergents.  If anything worsens please return for reevaluation.

## 2023-05-19 NOTE — ED Triage Notes (Signed)
Pt presents with possible hemorrhoids x 2 weeks. She tried an ointment and suppository with no relief. Complains of burning at rectum.   Pt also complains of lower abdominal pain and tenderness with off and on constipation x 2 weeks. She did have a small bm this morning.   Pt also complains of vaginal itching.

## 2023-05-23 LAB — CERVICOVAGINAL ANCILLARY ONLY
Bacterial Vaginitis (gardnerella): NEGATIVE
Candida Glabrata: NEGATIVE
Candida Vaginitis: NEGATIVE
Chlamydia: NEGATIVE
Comment: NEGATIVE
Comment: NEGATIVE
Comment: NORMAL
Comment: NEGATIVE
Comment: NEGATIVE
Comment: NEGATIVE
Neisseria Gonorrhea: NEGATIVE
Trichomonas: NEGATIVE

## 2023-05-27 ENCOUNTER — Telehealth (HOSPITAL_COMMUNITY): Payer: Self-pay

## 2023-05-27 NOTE — Telephone Encounter (Signed)
Called and spoke with Patient. Printed a coupon for the Hydrocortisone suppository so that she may pick this up from [facility].

## 2023-07-01 ENCOUNTER — Other Ambulatory Visit: Payer: Self-pay

## 2023-07-01 MED ORDER — ZEPBOUND 2.5 MG/0.5ML ~~LOC~~ SOAJ
2.5000 mg | SUBCUTANEOUS | 0 refills | Status: DC
  Filled 2023-07-01: qty 2, 28d supply, fill #0
